# Patient Record
Sex: Female | Born: 1975 | Race: Black or African American | Hispanic: No | Marital: Single | Smoking: Former smoker
Health system: Southern US, Community
[De-identification: ages and names within clinical notes are randomized; demographics above are authoritative.]

## PROBLEM LIST (undated history)

## (undated) DIAGNOSIS — F32A Depression, unspecified: Secondary | ICD-10-CM

## (undated) DIAGNOSIS — J939 Pneumothorax, unspecified: Secondary | ICD-10-CM

## (undated) DIAGNOSIS — F419 Anxiety disorder, unspecified: Secondary | ICD-10-CM

## (undated) DIAGNOSIS — F1021 Alcohol dependence, in remission: Secondary | ICD-10-CM

## (undated) DIAGNOSIS — Z8639 Personal history of other endocrine, nutritional and metabolic disease: Secondary | ICD-10-CM

## (undated) DIAGNOSIS — R011 Cardiac murmur, unspecified: Secondary | ICD-10-CM

## (undated) DIAGNOSIS — D649 Anemia, unspecified: Secondary | ICD-10-CM

## (undated) DIAGNOSIS — F329 Major depressive disorder, single episode, unspecified: Secondary | ICD-10-CM

## (undated) HISTORY — PX: APPENDECTOMY: SHX54

---
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
## 2008-05-10 ENCOUNTER — Emergency Department: Payer: Self-pay | Admitting: Emergency Medicine

## 2008-05-10 ENCOUNTER — Other Ambulatory Visit: Payer: Self-pay

## 2008-06-30 ENCOUNTER — Emergency Department: Payer: Self-pay | Admitting: Emergency Medicine

## 2008-07-13 ENCOUNTER — Emergency Department (HOSPITAL_COMMUNITY): Admission: EM | Admit: 2008-07-13 | Discharge: 2008-07-13 | Payer: Self-pay | Admitting: Emergency Medicine

## 2010-08-31 ENCOUNTER — Encounter: Payer: Self-pay | Admitting: Family Medicine

## 2011-05-15 LAB — BASIC METABOLIC PANEL
BUN: 12 mg/dL (ref 6–23)
Chloride: 107 mEq/L (ref 96–112)
GFR calc Af Amer: >60 mL/min (ref >60)
GFR calc non Af Amer: >60 mL/min (ref >60)
Potassium: 3.7 mEq/L (ref 3.5–5.1)

## 2011-05-15 LAB — CBC
HCT: 32.7 % — ABNORMAL LOW (ref 36.0–46.0)
Platelets: 99 10*3/uL — ABNORMAL LOW (ref 150–400)
RBC: 4.8 MIL/uL (ref 3.87–5.11)
WBC: 5.8 10*3/uL (ref 4.0–10.5)

## 2011-05-15 LAB — URINALYSIS, ROUTINE W REFLEX MICROSCOPIC
Nitrite: POSITIVE — AB
Urobilinogen, UA: 1 mg/dL (ref 0.0–1.0)

## 2011-05-15 LAB — DIFFERENTIAL
Eosinophils Relative: 0 % (ref 0–5)
Lymphocytes Relative: 8 % — ABNORMAL LOW (ref 12–46)
Lymphs Abs: 0.5 10*3/uL — ABNORMAL LOW (ref 0.7–4.0)
Monocytes Relative: 0 % — ABNORMAL LOW (ref 3–12)
Neutrophils Relative %: 92 % — ABNORMAL HIGH (ref 43–77)

## 2011-05-15 LAB — URINE MICROSCOPIC-ADD ON

## 2011-05-15 LAB — URINE CULTURE: Colony Count: 60000

## 2016-08-18 ENCOUNTER — Inpatient Hospital Stay (HOSPITAL_COMMUNITY)
Admission: EM | Admit: 2016-08-18 | Discharge: 2016-08-20 | DRG: 812 | Disposition: A | Discharge status: Home / Self Care | Payer: Medicaid - Out of State | Attending: Internal Medicine | Admitting: Internal Medicine

## 2016-08-18 ENCOUNTER — Encounter (HOSPITAL_COMMUNITY): Payer: Self-pay

## 2016-08-18 DIAGNOSIS — R197 Diarrhea, unspecified: Secondary | ICD-10-CM | POA: Diagnosis present

## 2016-08-18 DIAGNOSIS — R112 Nausea with vomiting, unspecified: Secondary | ICD-10-CM | POA: Diagnosis present

## 2016-08-18 DIAGNOSIS — Z79899 Other long term (current) drug therapy: Secondary | ICD-10-CM

## 2016-08-18 DIAGNOSIS — N920 Excessive and frequent menstruation with regular cycle: Secondary | ICD-10-CM | POA: Diagnosis present

## 2016-08-18 DIAGNOSIS — D500 Iron deficiency anemia secondary to blood loss (chronic): Secondary | ICD-10-CM | POA: Diagnosis present

## 2016-08-18 DIAGNOSIS — R109 Unspecified abdominal pain: Secondary | ICD-10-CM

## 2016-08-18 DIAGNOSIS — D259 Leiomyoma of uterus, unspecified: Secondary | ICD-10-CM | POA: Diagnosis present

## 2016-08-18 DIAGNOSIS — R1110 Vomiting, unspecified: Secondary | ICD-10-CM

## 2016-08-18 DIAGNOSIS — N390 Urinary tract infection, site not specified: Secondary | ICD-10-CM | POA: Diagnosis present

## 2016-08-18 DIAGNOSIS — K529 Noninfective gastroenteritis and colitis, unspecified: Secondary | ICD-10-CM | POA: Diagnosis present

## 2016-08-18 DIAGNOSIS — Z881 Allergy status to other antibiotic agents status: Secondary | ICD-10-CM

## 2016-08-18 DIAGNOSIS — F1021 Alcohol dependence, in remission: Secondary | ICD-10-CM | POA: Diagnosis present

## 2016-08-18 DIAGNOSIS — E876 Hypokalemia: Secondary | ICD-10-CM | POA: Diagnosis present

## 2016-08-18 DIAGNOSIS — D649 Anemia, unspecified: Secondary | ICD-10-CM | POA: Diagnosis present

## 2016-08-18 HISTORY — DX: Alcohol dependence, in remission: F10.21

## 2016-08-18 HISTORY — DX: Personal history of other endocrine, nutritional and metabolic disease: Z86.39

## 2016-08-18 HISTORY — DX: Pneumothorax, unspecified: J93.9

## 2016-08-18 HISTORY — DX: Anemia, unspecified: D64.9

## 2016-08-18 LAB — COMPREHENSIVE METABOLIC PANEL
ALBUMIN: 3.7 g/dL (ref 3.5–5.0)
ALT: 17 U/L (ref 14–54)
AST: 29 U/L (ref 15–41)
Alkaline Phosphatase: 57 U/L (ref 38–126)
Anion gap: 13 (ref 5–15)
BILIRUBIN TOTAL: 0.6 mg/dL (ref 0.3–1.2)
BUN: 11 mg/dL (ref 6–20)
CHLORIDE: 94 mmol/L — AB (ref 101–111)
CO2: 26 mmol/L (ref 22–32)
Calcium: 8.3 mg/dL — ABNORMAL LOW (ref 8.9–10.3)
Creatinine, Ser: 0.71 mg/dL (ref 0.44–1.00)
GFR calc Af Amer: >60 mL/min (ref >60)
GFR calc non Af Amer: >60 mL/min (ref >60)
GLUCOSE: 108 mg/dL — AB (ref 65–99)
POTASSIUM: 2.6 mmol/L — AB (ref 3.5–5.1)
SODIUM: 133 mmol/L — AB (ref 135–145)
TOTAL PROTEIN: 7.9 g/dL (ref 6.5–8.1)

## 2016-08-18 LAB — CBC
HEMATOCRIT: 17.8 % — AB (ref 36.0–46.0)
Hemoglobin: 4.6 g/dL — CL (ref 12.0–15.0)
MCH: 16.3 pg — AB (ref 26.0–34.0)
MCHC: 25.8 g/dL — AB (ref 30.0–36.0)
MCV: 63.1 fL — AB (ref 78.0–100.0)
Platelets: 520 10*3/uL — ABNORMAL HIGH (ref 150–400)
RBC: 2.82 MIL/uL — ABNORMAL LOW (ref 3.87–5.11)
RDW: 21.4 % — ABNORMAL HIGH (ref 11.5–15.5)
WBC: 13.6 10*3/uL — ABNORMAL HIGH (ref 4.0–10.5)

## 2016-08-18 LAB — LIPASE, BLOOD: Lipase: 26 U/L (ref 11–51)

## 2016-08-18 MED ORDER — ONDANSETRON HCL 4 MG/2ML IJ SOLN
4.0000 mg | Freq: Once | INTRAMUSCULAR | Status: AC | PRN
  Administered 2016-08-18: 4 mg via INTRAVENOUS
  Filled 2016-08-18: qty 2

## 2016-08-18 MED ORDER — SODIUM CHLORIDE 0.9 % IV BOLUS (SEPSIS)
2000.0000 mL | Freq: Once | INTRAVENOUS | Status: AC
  Administered 2016-08-18: 2000 mL via INTRAVENOUS

## 2016-08-18 NOTE — ED Notes (Signed)
CRITICAL VALUE ALERT  Critical value received: Hbg- 4.6  Date of notification:  08/18/16  Time of notification:  2333  Critical value read back:Yes.    Nurse who received alert:  Idelia Salm, RN  MD notified (1st page):  2334  Time of first page:  2334  MD notified (2nd page):  Time of second page:  Responding MD:  Dr Venora Maples  Time MD responded:  2335
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
## 2016-08-18 NOTE — ED Triage Notes (Signed)
For the past 7 days I have been nauseated and vomiting.  Having abdominal pain and diarrhea.  Feeling dizzy and feel like I am going to pass out.  Having periods of weakness and shortness of breath.

## 2016-08-19 ENCOUNTER — Observation Stay (HOSPITAL_COMMUNITY): Payer: Medicaid - Out of State

## 2016-08-19 ENCOUNTER — Encounter (HOSPITAL_COMMUNITY): Payer: Self-pay

## 2016-08-19 DIAGNOSIS — R112 Nausea with vomiting, unspecified: Secondary | ICD-10-CM | POA: Diagnosis present

## 2016-08-19 DIAGNOSIS — D500 Iron deficiency anemia secondary to blood loss (chronic): Secondary | ICD-10-CM | POA: Diagnosis present

## 2016-08-19 DIAGNOSIS — Z79899 Other long term (current) drug therapy: Secondary | ICD-10-CM | POA: Diagnosis not present

## 2016-08-19 DIAGNOSIS — E876 Hypokalemia: Secondary | ICD-10-CM | POA: Diagnosis present

## 2016-08-19 DIAGNOSIS — N390 Urinary tract infection, site not specified: Secondary | ICD-10-CM | POA: Diagnosis present

## 2016-08-19 DIAGNOSIS — N920 Excessive and frequent menstruation with regular cycle: Secondary | ICD-10-CM | POA: Diagnosis present

## 2016-08-19 DIAGNOSIS — D649 Anemia, unspecified: Secondary | ICD-10-CM | POA: Diagnosis present

## 2016-08-19 DIAGNOSIS — F1021 Alcohol dependence, in remission: Secondary | ICD-10-CM | POA: Diagnosis present

## 2016-08-19 DIAGNOSIS — D259 Leiomyoma of uterus, unspecified: Secondary | ICD-10-CM | POA: Diagnosis present

## 2016-08-19 DIAGNOSIS — R197 Diarrhea, unspecified: Secondary | ICD-10-CM | POA: Diagnosis present

## 2016-08-19 DIAGNOSIS — K529 Noninfective gastroenteritis and colitis, unspecified: Secondary | ICD-10-CM | POA: Diagnosis present

## 2016-08-19 DIAGNOSIS — Z881 Allergy status to other antibiotic agents status: Secondary | ICD-10-CM | POA: Diagnosis not present

## 2016-08-19 LAB — IRON AND TIBC
Iron: 7 ug/dL — ABNORMAL LOW (ref 28–170)
Saturation Ratios: 1 % — ABNORMAL LOW (ref 10.4–31.8)
TIBC: 535 ug/dL — ABNORMAL HIGH (ref 250–450)
UIBC: 528 ug/dL

## 2016-08-19 LAB — URINALYSIS, ROUTINE W REFLEX MICROSCOPIC
Bilirubin Urine: NEGATIVE
Glucose, UA: NEGATIVE mg/dL
Hgb urine dipstick: NEGATIVE
Ketones, ur: 5 mg/dL — AB
Nitrite: NEGATIVE
PH: 6 (ref 5.0–8.0)
Protein, ur: NEGATIVE mg/dL
SPECIFIC GRAVITY, URINE: 1.011 (ref 1.005–1.030)

## 2016-08-19 LAB — CBC
HEMATOCRIT: 23.8 % — AB (ref 36.0–46.0)
Hemoglobin: 6.9 g/dL — CL (ref 12.0–15.0)
MCH: 20.4 pg — ABNORMAL LOW (ref 26.0–34.0)
MCHC: 29 g/dL — AB (ref 30.0–36.0)
MCV: 70.2 fL — AB (ref 78.0–100.0)
Platelets: 454 10*3/uL — ABNORMAL HIGH (ref 150–400)
RBC: 3.39 MIL/uL — ABNORMAL LOW (ref 3.87–5.11)
RDW: 24.9 % — AB (ref 11.5–15.5)
WBC: 11.2 10*3/uL — AB (ref 4.0–10.5)

## 2016-08-19 LAB — MAGNESIUM: Magnesium: 1.5 mg/dL — ABNORMAL LOW (ref 1.7–2.4)

## 2016-08-19 LAB — RETICULOCYTES
RBC.: 2.86 MIL/uL — AB (ref 3.87–5.11)
RETIC COUNT ABSOLUTE: 57.2 10*3/uL (ref 19.0–186.0)
RETIC CT PCT: 2 % (ref 0.4–3.1)

## 2016-08-19 LAB — BASIC METABOLIC PANEL
Anion gap: 9 (ref 5–15)
BUN: 9 mg/dL (ref 6–20)
CHLORIDE: 100 mmol/L — AB (ref 101–111)
CO2: 25 mmol/L (ref 22–32)
Calcium: 7.7 mg/dL — ABNORMAL LOW (ref 8.9–10.3)
Creatinine, Ser: 0.55 mg/dL (ref 0.44–1.00)
GFR calc Af Amer: >60 mL/min (ref >60)
GFR calc non Af Amer: >60 mL/min (ref >60)
GLUCOSE: 113 mg/dL — AB (ref 65–99)
POTASSIUM: 3.8 mmol/L (ref 3.5–5.1)
Sodium: 134 mmol/L — ABNORMAL LOW (ref 135–145)

## 2016-08-19 LAB — FERRITIN: Ferritin: 3 ng/mL — ABNORMAL LOW (ref 11–307)

## 2016-08-19 LAB — HEMOGLOBIN AND HEMATOCRIT, BLOOD
HEMATOCRIT: 27.5 % — AB (ref 36.0–46.0)
Hemoglobin: 8.2 g/dL — ABNORMAL LOW (ref 12.0–15.0)

## 2016-08-19 LAB — PREPARE RBC (CROSSMATCH)

## 2016-08-19 LAB — HCG, SERUM, QUALITATIVE: PREG SERUM: NEGATIVE

## 2016-08-19 LAB — ABO/RH: ABO/RH(D): A POS

## 2016-08-19 LAB — VITAMIN B12: VITAMIN B 12: 375 pg/mL (ref 180–914)

## 2016-08-19 LAB — FOLATE: Folate: 10.2 ng/mL (ref >5.9)

## 2016-08-19 MED ORDER — METHYLPREDNISOLONE SODIUM SUCC 40 MG IJ SOLR
60.0000 mg | Freq: Once | INTRAMUSCULAR | Status: AC
Start: 2016-08-19 — End: 2016-08-19

## 2016-08-19 MED ORDER — SODIUM CHLORIDE 0.9 % IV SOLN: Freq: Once | INTRAVENOUS | Status: AC

## 2016-08-19 MED ORDER — ONDANSETRON HCL 4 MG/2ML IJ SOLN
INTRAMUSCULAR | Status: AC
  Filled 2016-08-19: qty 2

## 2016-08-19 MED ORDER — MAGNESIUM SULFATE IN D5W 1-5 GM/100ML-% IV SOLN
1.0000 g | Freq: Once | INTRAVENOUS | Status: AC
  Administered 2016-08-19: 1 g via INTRAVENOUS

## 2016-08-19 MED ORDER — HYDROMORPHONE HCL 1 MG/ML IJ SOLN
0.5000 mg | Freq: Once | INTRAMUSCULAR | Status: AC
  Administered 2016-08-19: 0.5 mg via INTRAVENOUS
  Filled 2016-08-19: qty 1

## 2016-08-19 MED ORDER — ADULT MULTIVITAMIN W/MINERALS CH
1.0000 | ORAL_TABLET | Freq: Every day | ORAL | Status: DC
  Administered 2016-08-19 – 2016-08-20 (×2): 1 via ORAL
  Filled 2016-08-19 (×4): qty 1

## 2016-08-19 MED ORDER — HYDROMORPHONE HCL 1 MG/ML IJ SOLN
0.5000 mg | INTRAMUSCULAR | Status: DC | PRN
  Administered 2016-08-19 – 2016-08-20 (×5): 0.5 mg via INTRAVENOUS
  Filled 2016-08-19 (×5): qty 1

## 2016-08-19 MED ORDER — SODIUM CHLORIDE 0.9 % IV SOLN
Freq: Once | INTRAVENOUS | Status: AC
  Administered 2016-08-19: 02:00:00 via INTRAVENOUS

## 2016-08-19 MED ORDER — DIPHENHYDRAMINE HCL 50 MG/ML IJ SOLN
25.0000 mg | Freq: Once | INTRAMUSCULAR | Status: AC
Start: 2016-08-19 — End: 2016-08-19
  Administered 2016-08-19: 25 mg via INTRAVENOUS

## 2016-08-19 MED ORDER — POTASSIUM CHLORIDE IN NACL 40-0.9 MEQ/L-% IV SOLN
INTRAVENOUS | Status: DC
  Administered 2016-08-19: 100 mL/h via INTRAVENOUS
  Filled 2016-08-19 (×3): qty 1000

## 2016-08-19 MED ORDER — POTASSIUM CHLORIDE CRYS ER 20 MEQ PO TBCR
40.0000 meq | EXTENDED_RELEASE_TABLET | Freq: Once | ORAL | Status: AC
  Administered 2016-08-19: 40 meq via ORAL
  Filled 2016-08-19: qty 2

## 2016-08-19 MED ORDER — ONDANSETRON HCL 4 MG/2ML IJ SOLN
4.0000 mg | Freq: Once | INTRAMUSCULAR | Status: AC | PRN
  Administered 2016-08-19: 4 mg via INTRAVENOUS

## 2016-08-19 MED ORDER — FAMOTIDINE IN NACL 20-0.9 MG/50ML-% IV SOLN
20.0000 mg | INTRAVENOUS | Status: DC
  Administered 2016-08-19 – 2016-08-20 (×2): 20 mg via INTRAVENOUS
  Filled 2016-08-19 (×3): qty 50

## 2016-08-19 MED ORDER — DEXTROSE 5 % IV SOLN
1.0000 g | INTRAVENOUS | Status: DC
  Administered 2016-08-19: 1 g via INTRAVENOUS
  Filled 2016-08-19: qty 10

## 2016-08-19 MED ORDER — PROMETHAZINE HCL 25 MG/ML IJ SOLN: 12.5000 mg | Freq: Four times a day (QID) | INTRAMUSCULAR | Status: DC | PRN

## 2016-08-19 MED ORDER — KCL IN DEXTROSE-NACL 20-5-0.9 MEQ/L-%-% IV SOLN
INTRAVENOUS | Status: DC
  Administered 2016-08-19: 03:00:00 via INTRAVENOUS
  Filled 2016-08-19 (×5): qty 1000

## 2016-08-19 MED ORDER — FERROUS SULFATE 325 (65 FE) MG PO TABS
325.0000 mg | ORAL_TABLET | Freq: Every day | ORAL | Status: DC
  Administered 2016-08-19: 325 mg via ORAL
  Filled 2016-08-19 (×3): qty 1

## 2016-08-19 MED ORDER — ESCITALOPRAM OXALATE 10 MG PO TABS
10.0000 mg | ORAL_TABLET | Freq: Every day | ORAL | Status: DC
  Administered 2016-08-19 – 2016-08-20 (×2): 10 mg via ORAL
  Filled 2016-08-19 (×4): qty 1

## 2016-08-19 MED ORDER — DIPHENHYDRAMINE HCL 50 MG/ML IJ SOLN
INTRAMUSCULAR | Status: AC
  Filled 2016-08-19: qty 1

## 2016-08-19 MED ORDER — POTASSIUM CHLORIDE 10 MEQ/100ML IV SOLN
10.0000 meq | INTRAVENOUS | Status: AC
  Administered 2016-08-19 (×3): 10 meq via INTRAVENOUS
  Filled 2016-08-19 (×3): qty 100

## 2016-08-19 MED ORDER — VITAMIN D (ERGOCALCIFEROL) 1.25 MG (50000 UNIT) PO CAPS: 50000.0000 [IU] | ORAL_CAPSULE | ORAL | Status: DC

## 2016-08-19 MED ORDER — METHYLPREDNISOLONE SODIUM SUCC 125 MG IJ SOLR
INTRAMUSCULAR | Status: AC
  Administered 2016-08-19: 60 mg
  Filled 2016-08-19: qty 2

## 2016-08-19 MED ORDER — ONDANSETRON HCL 4 MG/2ML IJ SOLN
4.0000 mg | Freq: Four times a day (QID) | INTRAMUSCULAR | Status: DC | PRN
  Filled 2016-08-19: qty 2

## 2016-08-19 MED ORDER — DIPHENHYDRAMINE HCL 25 MG PO CAPS
25.0000 mg | ORAL_CAPSULE | ORAL | Status: DC | PRN
  Administered 2016-08-20: 25 mg via ORAL
  Filled 2016-08-19: qty 1

## 2016-08-19 NOTE — ED Notes (Signed)
CRITICAL VALUE ALERT  Critical value received:  hgb 6.9  Date of notification:  08/19/16  Time of notification:  0820  Critical value read back:Yes.    Nurse who received alert:  c Allante Whitmire rn  MD notified (1st page):  Dr Caryn Section [phone]  Time of first page:  0850  MD notified (2nd page):  Time of second page:  Responding MD:    Time MD responded:
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
## 2016-08-19 NOTE — Progress Notes (Signed)
1433 Hgb 8.2 post 3 blood transfusions, MD notified.

## 2016-08-19 NOTE — ED Notes (Signed)
Pt has received 1 unit of PRBC for a total of 335 ml.  Currently have not started the second blood infusion.  Duplicate volume entered on vitals signs post #1 unit of PRBC.

## 2016-08-19 NOTE — ED Notes (Signed)
CRITICAL VALUE ALERT  Critical value received:  Potassium 2.6  Date of notification:  08/18/16  Time of notification:  2359  Critical value read back:yes  Nurse who received alert: Legrand Como, RN  MD notified (1st page):  Dr. Venora Maples  Time of first page:  0000

## 2016-08-19 NOTE — Progress Notes (Signed)
Patient comfortable in bed. Introduction of self. Patient made aware to call when needed and that a urine and stool sample is needed when possible.

## 2016-08-19 NOTE — ED Notes (Signed)
Blood finished. Lab aware and will draw labs one hour after to see if 3rd unit blood needed per Dr Marin Comment. Pt c/o nausea.

## 2016-08-19 NOTE — ED Notes (Signed)
Pt having allergic reaction to rocephin which was immediately stopped. Dr Loma Newton called and received vo. Pt denise trouble breathing or swallowing pt has redness and mild welts to face.

## 2016-08-19 NOTE — ED Notes (Signed)
Lab aware needing 3rd unit of blood to transfuse. [place] also aware dr Caryn Section wanted transvaginal US also.

## 2016-08-19 NOTE — ED Notes (Signed)
Pt taken to [place]. Nad.

## 2016-08-19 NOTE — Progress Notes (Signed)
Patient is a 41 year old with a history of hypokalemia anxiety/depression, long history of heavy menstrual periods with anemia, and recovering alcoholic, who was admitted this morning by Dr. Marin Comment for N/V/D and weakness for several days. She was found to have a hemoglobin of 4.6, microcytosis with MCV of 63, and potassium of 2.6. Her WBC was 13.6 and her platelet count was 520. Her urinalysis revealed a few bacteria and 6-30 WBCs.  -Patient was transfused 2 units of packed red blood cells and her hemoglobin improved to 6.9. Will transfuse another unit. -Patient was given several ones of potassium chloride and potassium chloride orally. Her serum potassium improved from 2.6 to 3.8. Her magnesium level was 1.5. She was given IV magnesium sulfate. We'll continue supplementing her potassium in the IV fluids. -Due to the N/V/D, with some lower back pain, will start when necessary Zofran and Phenergan; start IV Pepcid; order a GI pathogen panel; and downgrade her diet to full liquids. Start hydromorphone as needed for pain. -Due to a suspicion of UTI, will order a urine culture and start Rocephin empirically. -For further investigation, will order a pelvic ultrasound and abdominal ultrasound. -Patient will likely need an outpatient gynecology referral.

## 2016-08-19 NOTE — ED Notes (Cosign Needed)
Pt informed this nurse that she has not been taking iron tablets as she is supposed to, before this past week as she was sick and couldn't take anything PO- she was only taking iron pills about 3 days out of a week. Dr Venora Maples informed.

## 2016-08-19 NOTE — ED Notes (Signed)
Per NT, pt states she saw a little bit of blood in her stool just now. Dr Caryn Section aware.

## 2016-08-19 NOTE — ED Notes (Signed)
Blood consent has been signed by patient.

## 2016-08-19 NOTE — ED Notes (Signed)
Dr Caryn Section in with pt

## 2016-08-19 NOTE — ED Notes (Signed)
Pt just returned from [place]. States back pain is back rating 9. Advised pt pain meds not due until approx 30 more minutes. Floor rn aware.

## 2016-08-20 DIAGNOSIS — D500 Iron deficiency anemia secondary to blood loss (chronic): Principal | ICD-10-CM

## 2016-08-20 LAB — BASIC METABOLIC PANEL
ANION GAP: 3 — AB (ref 5–15)
BUN: 8 mg/dL (ref 6–20)
CO2: 25 mmol/L (ref 22–32)
Calcium: 8.3 mg/dL — ABNORMAL LOW (ref 8.9–10.3)
Chloride: 107 mmol/L (ref 101–111)
Creatinine, Ser: 0.54 mg/dL (ref 0.44–1.00)
Glucose, Bld: 97 mg/dL (ref 65–99)
Potassium: 4.2 mmol/L (ref 3.5–5.1)
SODIUM: 135 mmol/L (ref 135–145)

## 2016-08-20 LAB — CBC
HEMATOCRIT: 27.2 % — AB (ref 36.0–46.0)
Hemoglobin: 7.9 g/dL — ABNORMAL LOW (ref 12.0–15.0)
MCH: 21 pg — ABNORMAL LOW (ref 26.0–34.0)
MCHC: 29 g/dL — ABNORMAL LOW (ref 30.0–36.0)
MCV: 72.1 fL — AB (ref 78.0–100.0)
PLATELETS: 462 10*3/uL — AB (ref 150–400)
RBC: 3.77 MIL/uL — AB (ref 3.87–5.11)
RDW: 23.1 % — ABNORMAL HIGH (ref 11.5–15.5)
WBC: 10.7 10*3/uL — AB (ref 4.0–10.5)

## 2016-08-20 LAB — GASTROINTESTINAL PANEL BY PCR, STOOL (REPLACES STOOL CULTURE)

## 2016-08-20 LAB — MAGNESIUM: Magnesium: 1.5 mg/dL — ABNORMAL LOW (ref 1.7–2.4)

## 2016-08-20 MED ORDER — PANTOPRAZOLE SODIUM 40 MG PO TBEC: 40.0000 mg | DELAYED_RELEASE_TABLET | Freq: Every day | ORAL | 0 refills | Status: DC

## 2016-08-20 MED ORDER — SODIUM CHLORIDE 0.9 % IV SOLN
510.0000 mg | Freq: Once | INTRAVENOUS | Status: AC
  Administered 2016-08-20: 510 mg via INTRAVENOUS
  Filled 2016-08-20: qty 17

## 2016-08-20 MED ORDER — FERROUS SULFATE 325 (65 FE) MG PO TABS: 325.0000 mg | ORAL_TABLET | Freq: Three times a day (TID) | ORAL | 0 refills | Status: DC

## 2016-08-20 NOTE — Progress Notes (Signed)
Patient with orders to be discharge home. Discharge instructions given, patient verbalized understanding. Prescriptions given. Patient stable. Patient left in private vehicle with family.  
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
## 2016-08-20 NOTE — Discharge Summary (Signed)
Brittney Melendez [id number] DOB: 1975/11/16 DOA: 08/18/2016  PCP: No PCP Per Patient  Admit date: 08/18/2016  Discharge date: 08/20/2016  Admitted From: Home   Disposition:  Home   Recommendations for Outpatient Follow-up:   Follow up with PCP in 1-2 weeks  PCP Please obtain BMP/CBC, 2 view CXR in 1week,  (see Discharge instructions)   PCP Please follow up on the following pending results: Follow anemia/iron panel closely.   Home Health: None  Equipment/Devices: None Consultations: None Discharge Condition: Stable   CODE STATUS: Full   Diet Recommendation:  Heart Healthy    Chief Complaint  Patient presents with  . Emesis     Brief history of present illness from the day of admission and additional interim summary    Brittney Melendez is an 41 y.o. female with hx of menorrhagia, on iron supplement, hardly ever saw her gynecologist, presented to the ER with nausea, vomiting, and watery diarrhea.  She had no evidence of GI bleeding and had no epigastric pain.  Evaluation in the ER included stable hemodynamics.  HR 110.  Her Hb was found to be 4 g per dL with microcytic indices.  She admitted to feeling weak and having DOE.   Her K was low at 2.6.  She has normal renal fx.  Hospitalist was asked to admit her for anemia due to menorrhagia.   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 Will be discharged home on thrice a day iron supplementation with outpatient PCP and GYN follow-up. No evidence  of GI blood loss.  2. Gastroenteritis. Resolved.  Discharge diagnosis     Principal Problem:   Nausea & vomiting Active Problems:   Menorrhagia   Hypokalemia   Anemia due to chronic blood loss   Diarrhea in adult patient   UTI (urinary tract infection) with pyuria   Anemia    Discharge instructions    Discharge Instructions    Diet - low sodium heart healthy    Complete by:  As directed    Discharge instructions    Complete by:  As directed    Follow with Primary MD in 7 days   Get CBC, CMP, Iron panel checked  by Primary MD or SNF MD in 5-7 days ( we routinely change or add medications that can affect your baseline labs and fluid status, therefore we recommend that you get the mentioned basic workup next visit with your PCP, your PCP may decide not to get them or add new tests based on their clinical decision)   Activity: As tolerated with Full fall precautions use walker/cane & assistance as needed   Disposition Home     Diet:  Heart Healthy  For Heart failure patients - Check your Weight same time everyday, if you gain over 2 pounds, or you develop in leg swelling, experience more shortness of breath or chest pain, call your Primary MD immediately. Follow Cardiac Low Salt Diet and 1.5 lit/day fluid restriction.   On your next visit with your primary care physician please Get Medicines reviewed and adjusted.   Please request your Prim.MD to go over all Hospital Tests and Procedure/Radiological results at the follow up, please get all Hospital records sent to your Prim MD by signing hospital release before you go home.   If you experience worsening of your admission symptoms, develop shortness of breath, life threatening emergency, suicidal or homicidal thoughts you must seek medical attention immediately by calling 911 or calling your MD immediately  if symptoms less severe.  You Must read complete instructions/literature along with all the possible adverse  reactions/side effects for all the Medicines you take and that have been prescribed to you. Take any new Medicines after you have completely understood and accpet all the possible adverse reactions/side effects.   Do not drive, operate heavy machinery, perform activities at heights, swimming or participation in water activities or provide baby sitting services if your were admitted for syncope or siezures until you have seen by Primary MD or a Neurologist and advised to do so again.  Do not drive when taking Pain medications.    Do not take more than prescribed Pain, Sleep and Anxiety Medications  Special Instructions: If you have smoked or chewed Tobacco  in the last 2 yrs please stop smoking, stop any regular Alcohol  and or any Recreational drug use.  Wear Seat belts while driving.   Please note  You were cared for by a hospitalist during your hospital stay. If you have any questions about your discharge medications or the care you received while you were in the hospital after you are discharged, you can call the unit and asked to speak with the hospitalist on call if the hospitalist that took care of you is not available. Once you are discharged, your primary care physician will handle any further medical issues. Please note that NO REFILLS for any discharge medications will be authorized once you are discharged, as it is imperative that you return to your primary care physician (or establish a relationship with a primary care physician if you do not have one) for your aftercare needs so that they can reassess your need for medications and monitor your lab values.   Increase activity slowly    Complete by:  As directed       Discharge Medications   Allergies as of 08/20/2016      Reactions   Rocephin [ceftriaxone] Rash   Sulfa Antibiotics Itching      Medication List    TAKE these medications   escitalopram 10 MG tablet Commonly known as:  LEXAPRO Take 10 mg by mouth daily.     ferrous sulfate 325 (65 FE) MG tablet Take 1 tablet (325 mg total) by mouth 3 (three) times daily with meals. What changed:  when to take this   MULTIVITAMIN/FLUORIDE 1 MG Chew Chew 1 tablet by mouth daily.   ondansetron 4 MG tablet Commonly known as:  ZOFRAN TAKE 1 TABLET BY MOUTH 3 TIMES A DAY AS NEEDED FOR NAUSEA  AND VOMITING   pantoprazole 40 MG tablet Commonly known as:  PROTONIX Take 1 tablet (40 mg total) by mouth daily.   Vitamin D (Ergocalciferol) 50000  units Caps capsule Commonly known as:  DRISDOL Take 50,000 Units by mouth once a week.       Follow-up Information    [name], MD. Schedule an appointment as soon as possible for a visit in 1 week(s).   Specialties:  Obstetrics and Gynecology, Radiology Why:  Menorrhagia with severe anemia Contact information: [address] [place] [id number] [phone]           Major procedures and Radiology Reports - PLEASE review detailed and final reports thoroughly  -         US Abdomen Complete  Result Date: 08/19/2016 CLINICAL DATA:  Nausea, vomiting, diarrhea for 1 week EXAM: ABDOMEN ULTRASOUND COMPLETE COMPARISON:  None. FINDINGS: Gallbladder: No gallstones or wall thickening visualized. No sonographic Murphy sign noted by sonographer. Common bile duct: Diameter: 5 mm in diameter within normal limits Liver: No focal hepatic mass. Mild increase liver echogenicity suspicious for fatty infiltration. IVC: No abnormality visualized. Pancreas: Visualized portion unremarkable. Limited visualization due to abundant bowel gas Spleen: Size and appearance within normal limits. Measures 9 cm in length Right Kidney: Length: 11.5 cm. Echogenicity within normal limits. No mass or hydronephrosis visualized. Left Kidney: Length: 11.8 cm. Echogenicity within normal limits. No mass or hydronephrosis visualized. Abdominal aorta: No aneurysm visualized. Measures up to 1.9 cm in diameter. Other findings: None. IMPRESSION:  1. No gallstones are noted within gallbladder. No sonographic Murphy's sign. 2. Mild increased liver echogenicity suspicious for fatty infiltration. 3. No hydronephrosis. 4. No aortic aneurysm. Electronically Signed   By: Lahoma Crocker M.D.   On: 08/19/2016 12:37   US Transvaginal Non-ob  Result Date: 08/19/2016 CLINICAL DATA:  Nausea and vomiting.  Abnormal uterine bleeding. EXAM: TRANSABDOMINAL AND TRANSVAGINAL ULTRASOUND OF PELVIS TECHNIQUE: Both transabdominal and transvaginal ultrasound examinations of the pelvis were performed. Transabdominal technique was performed for global imaging of the pelvis including uterus, ovaries, adnexal regions, and pelvic cul-de-sac. It was necessary to proceed with endovaginal exam following the transabdominal exam to visualize the uterus and ovaries. COMPARISON:  No recent prior. FINDINGS: Uterus Measurements: 12.6 x 5.9 x 6.7 cm. 2.5 cm anterior fundal fibroid. 2.8 cm posterior body fibroid. Nabothian cyst. Endometrium Thickness:  13 mm.  No focal abnormality visualized. Right ovary Measurements: 3.9 x 1.9 x 2.3 cm. Normal appearance/no adnexal mass. Left ovary Measurements: 2.3 x 3.4 x 2.3 cm. Normal appearance/no adnexal mass. Other findings No abnormal free fluid. IMPRESSION: 1.  Small uterine fibroids. 2. Exam otherwise unremarkable . Electronically Signed   By: Marcello Moores  Register   On: 08/19/2016 12:34   US Pelvis Complete  Result Date: 08/19/2016 CLINICAL DATA:  Nausea and vomiting.  Abnormal uterine bleeding. EXAM: TRANSABDOMINAL AND TRANSVAGINAL ULTRASOUND OF PELVIS TECHNIQUE: Both transabdominal and transvaginal ultrasound examinations of the pelvis were performed. Transabdominal technique was performed for global imaging of the pelvis including uterus, ovaries, adnexal regions, and pelvic cul-de-sac. It was necessary to proceed with endovaginal exam following the transabdominal exam to visualize the uterus and ovaries. COMPARISON:  No recent prior. FINDINGS:  Uterus Measurements: 12.6 x 5.9 x 6.7 cm. 2.5 cm anterior fundal fibroid. 2.8 cm posterior body fibroid. Nabothian cyst. Endometrium Thickness:  13 mm.  No focal abnormality visualized. Right ovary Measurements: 3.9 x 1.9 x 2.3 cm. Normal appearance/no adnexal mass. Left ovary Measurements: 2.3 x 3.4 x 2.3 cm. Normal appearance/no adnexal mass. Other findings No abnormal free fluid. IMPRESSION: 1.  Small uterine fibroids. 2. Exam otherwise unremarkable . Electronically Signed  By: [place]   On: 08/19/2016 12:34    Micro Results    No results found for this or any previous visit (from the past 240 hour(s)).  Today   Subjective    Anvi Srey today has no headache,no chest abdominal pain,no new weakness tingling or numbness, feels much better wants to go home today.    Objective   Blood pressure 115/65, pulse 64, temperature 98.1 F (36.7 C), temperature source Oral, resp. rate 20, height 5\' 7"  (1.702 m), weight 74.8 kg (165 lb), last menstrual period 08/08/2016, SpO2 100 %.   Intake/Output Summary (Last 24 hours) at 08/20/16 1024 Last data filed at 08/19/16 2100  Gross per 24 hour  Intake             1625 ml  Output              500 ml  Net             1125 ml    Exam Awake Alert, Oriented x 3, No new F.N deficits, Normal affect [place].AT,PERRAL Supple Neck,No JVD, No cervical lymphadenopathy appriciated.  Symmetrical Chest wall movement, Good air movement bilaterally, CTAB RRR,No Gallops,Rubs or new Murmurs, No Parasternal Heave +ve B.Sounds, Abd Soft, Non tender, No organomegaly appriciated, No rebound -guarding or rigidity. No Cyanosis, Clubbing or edema, No new Rash or bruise   Data Review   CBC w Diff: Lab Results  Component Value Date   WBC 10.7 (H) 08/20/2016   HGB 7.9 (L) 08/20/2016   HCT 27.2 (L) 08/20/2016   PLT 462 (H) 08/20/2016   LYMPHOPCT 8 (L) 07/13/2008   MONOPCT 0 (L) 07/13/2008   EOSPCT 0 07/13/2008   BASOPCT 0 07/13/2008    CMP: Lab  Results  Component Value Date   NA 135 08/20/2016   K 4.2 08/20/2016   CL 107 08/20/2016   CO2 25 08/20/2016   BUN 8 08/20/2016   CREATININE 0.54 08/20/2016   PROT 7.9 08/18/2016   ALBUMIN 3.7 08/18/2016   BILITOT 0.6 08/18/2016   ALKPHOS 57 08/18/2016   AST 29 08/18/2016   ALT 17 08/18/2016  . Results for SAYANI, SULKOWSKI (MRN [id number]) as of 08/20/2016 10:00  Ref. Range 08/18/2016 23:12 08/19/2016 07:59  Iron Latest Ref Range: 28 - 170 ug/dL 7 (L)   UIBC Latest Units: ug/dL 528   TIBC Latest Ref Range: 250 - 450 ug/dL 535 (H)   Saturation Ratios Latest Ref Range: 10.4 - 31.8 % 1 (L)   Ferritin Latest Ref Range: 11 - 307 ng/mL 3 (L)   Folate Latest Ref Range: >5.9 ng/mL  10.2    Total Time in preparing paper work, data evaluation and todays exam - 35 minutes  Lala Lund K M.D on 08/20/2016 at 10:24 AM  [facility]   Office  [phone]

## 2016-08-20 NOTE — Discharge Instructions (Signed)
Follow with Primary MD in 7 days   Get CBC, CMP, Iron panel checked  by Primary MD or SNF MD in 5-7 days ( we routinely change or add medications that can affect your baseline labs and fluid status, therefore we recommend that you get the mentioned basic workup next visit with your PCP, your PCP may decide not to get them or add new tests based on their clinical decision)   Activity: As tolerated with Full fall precautions use walker/cane & assistance as needed   Disposition Home     Diet:  Heart Healthy    For Heart failure patients - Check your Weight same time everyday, if you gain over 2 pounds, or you develop in leg swelling, experience more shortness of breath or chest pain, call your Primary MD immediately. Follow Cardiac Low Salt Diet and 1.5 lit/day fluid restriction.   On your next visit with your primary care physician please Get Medicines reviewed and adjusted.   Please request your Prim.MD to go over all Hospital Tests and Procedure/Radiological results at the follow up, please get all Hospital records sent to your Prim MD by signing hospital release before you go home.   If you experience worsening of your admission symptoms, develop shortness of breath, life threatening emergency, suicidal or homicidal thoughts you must seek medical attention immediately by calling 911 or calling your MD immediately  if symptoms less severe.  You Must read complete instructions/literature along with all the possible adverse reactions/side effects for all the Medicines you take and that have been prescribed to you. Take any new Medicines after you have completely understood and accpet all the possible adverse reactions/side effects.   Do not drive, operate heavy machinery, perform activities at heights, swimming or participation in water activities or provide baby sitting services if your were admitted for syncope or siezures until you have seen by Primary MD or a Neurologist and advised to do  so again.  Do not drive when taking Pain medications.    Do not take more than prescribed Pain, Sleep and Anxiety Medications  Special Instructions: If you have smoked or chewed Tobacco  in the last 2 yrs please stop smoking, stop any regular Alcohol  and or any Recreational drug use.  Wear Seat belts while driving.   Please note  You were cared for by a hospitalist during your hospital stay. If you have any questions about your discharge medications or the care you received while you were in the hospital after you are discharged, you can call the unit and asked to speak with the hospitalist on call if the hospitalist that took care of you is not available. Once you are discharged, your primary care physician will handle any further medical issues. Please note that NO REFILLS for any discharge medications will be authorized once you are discharged, as it is imperative that you return to your primary care physician (or establish a relationship with a primary care physician if you do not have one) for your aftercare needs so that they can reassess your need for medications and monitor your lab values.

## 2016-08-21 LAB — URINE CULTURE

## 2016-08-22 LAB — TYPE AND SCREEN
ABO/RH(D): A POS
Antibody Screen: NEGATIVE
UNIT DIVISION: 0
UNIT DIVISION: 0
UNIT DIVISION: 0
Unit division: 0

## 2016-12-27 ENCOUNTER — Encounter (HOSPITAL_COMMUNITY): Payer: Self-pay | Admitting: Emergency Medicine

## 2016-12-27 ENCOUNTER — Inpatient Hospital Stay (HOSPITAL_COMMUNITY)
Admission: EM | Admit: 2016-12-27 | Discharge: 2016-12-29 | DRG: 812 | Disposition: A | Discharge status: Home / Self Care | Payer: Medicaid - Out of State | Attending: Family Medicine | Admitting: Family Medicine

## 2016-12-27 DIAGNOSIS — R112 Nausea with vomiting, unspecified: Secondary | ICD-10-CM | POA: Diagnosis present

## 2016-12-27 DIAGNOSIS — G629 Polyneuropathy, unspecified: Secondary | ICD-10-CM | POA: Diagnosis present

## 2016-12-27 DIAGNOSIS — D500 Iron deficiency anemia secondary to blood loss (chronic): Secondary | ICD-10-CM | POA: Diagnosis not present

## 2016-12-27 DIAGNOSIS — D649 Anemia, unspecified: Secondary | ICD-10-CM | POA: Diagnosis present

## 2016-12-27 DIAGNOSIS — R740 Nonspecific elevation of levels of transaminase and lactic acid dehydrogenase [LDH]: Secondary | ICD-10-CM | POA: Diagnosis present

## 2016-12-27 DIAGNOSIS — N920 Excessive and frequent menstruation with regular cycle: Secondary | ICD-10-CM | POA: Diagnosis not present

## 2016-12-27 DIAGNOSIS — E876 Hypokalemia: Secondary | ICD-10-CM | POA: Diagnosis present

## 2016-12-27 DIAGNOSIS — F1021 Alcohol dependence, in remission: Secondary | ICD-10-CM | POA: Diagnosis present

## 2016-12-27 DIAGNOSIS — D696 Thrombocytopenia, unspecified: Secondary | ICD-10-CM | POA: Diagnosis present

## 2016-12-27 DIAGNOSIS — Z79899 Other long term (current) drug therapy: Secondary | ICD-10-CM

## 2016-12-27 DIAGNOSIS — R1110 Vomiting, unspecified: Secondary | ICD-10-CM

## 2016-12-27 LAB — CBC
HEMATOCRIT: 25.6 % — AB (ref 36.0–46.0)
HEMOGLOBIN: 6.8 g/dL — AB (ref 12.0–15.0)
MCH: 17.3 pg — ABNORMAL LOW (ref 26.0–34.0)
MCHC: 26.6 g/dL — ABNORMAL LOW (ref 30.0–36.0)
MCV: 65 fL — AB (ref 78.0–100.0)
Platelets: 53 10*3/uL — ABNORMAL LOW (ref 150–400)
RBC: 3.94 MIL/uL (ref 3.87–5.11)
RDW: 19.3 % — AB (ref 11.5–15.5)
WBC: 7.3 10*3/uL (ref 4.0–10.5)

## 2016-12-27 LAB — URINALYSIS, ROUTINE W REFLEX MICROSCOPIC
BACTERIA UA: NONE SEEN
BILIRUBIN URINE: NEGATIVE
Glucose, UA: NEGATIVE mg/dL
KETONES UR: 5 mg/dL — AB
LEUKOCYTES UA: NEGATIVE
Nitrite: NEGATIVE
PROTEIN: 100 mg/dL — AB
Specific Gravity, Urine: 1.026 (ref 1.005–1.030)
pH: 6 (ref 5.0–8.0)

## 2016-12-27 LAB — PREPARE RBC (CROSSMATCH)

## 2016-12-27 LAB — COMPREHENSIVE METABOLIC PANEL
ALT: 64 U/L — ABNORMAL HIGH (ref 14–54)
AST: 128 U/L — AB (ref 15–41)
Albumin: 4.2 g/dL (ref 3.5–5.0)
Alkaline Phosphatase: 82 U/L (ref 38–126)
Anion gap: 12 (ref 5–15)
BUN: 13 mg/dL (ref 6–20)
CHLORIDE: 102 mmol/L (ref 101–111)
CO2: 25 mmol/L (ref 22–32)
Calcium: 9.2 mg/dL (ref 8.9–10.3)
Creatinine, Ser: 0.64 mg/dL (ref 0.44–1.00)
GFR calc Af Amer: >60 mL/min (ref >60)
Glucose, Bld: 116 mg/dL — ABNORMAL HIGH (ref 65–99)
POTASSIUM: 3.4 mmol/L — AB (ref 3.5–5.1)
Sodium: 139 mmol/L (ref 135–145)
Total Bilirubin: 1.5 mg/dL — ABNORMAL HIGH (ref 0.3–1.2)
Total Protein: 8.9 g/dL — ABNORMAL HIGH (ref 6.5–8.1)

## 2016-12-27 LAB — I-STAT TROPONIN, ED: TROPONIN I, POC: 0 ng/mL (ref 0.00–0.08)

## 2016-12-27 LAB — ETHANOL

## 2016-12-27 LAB — I-STAT BETA HCG BLOOD, ED (MC, WL, AP ONLY): I-stat hCG, quantitative: <5 m[IU]/mL (ref <5)

## 2016-12-27 LAB — LIPASE, BLOOD: LIPASE: 30 U/L (ref 11–51)

## 2016-12-27 MED ORDER — ONDANSETRON HCL 4 MG/2ML IJ SOLN
4.0000 mg | Freq: Four times a day (QID) | INTRAMUSCULAR | Status: DC | PRN
  Administered 2016-12-27 – 2016-12-28 (×2): 4 mg via INTRAVENOUS
  Filled 2016-12-27 (×2): qty 2

## 2016-12-27 MED ORDER — MORPHINE SULFATE (PF) 4 MG/ML IV SOLN
4.0000 mg | Freq: Once | INTRAVENOUS | Status: AC
  Administered 2016-12-27: 4 mg via INTRAVENOUS
  Filled 2016-12-27: qty 1

## 2016-12-27 MED ORDER — ONDANSETRON HCL 4 MG/2ML IJ SOLN
4.0000 mg | Freq: Once | INTRAMUSCULAR | Status: AC | PRN
  Administered 2016-12-27: 4 mg via INTRAVENOUS
  Filled 2016-12-27: qty 2

## 2016-12-27 MED ORDER — MORPHINE SULFATE (PF) 2 MG/ML IV SOLN
1.0000 mg | Freq: Once | INTRAVENOUS | Status: AC
  Administered 2016-12-27: 1 mg via INTRAVENOUS
  Filled 2016-12-27: qty 1

## 2016-12-27 MED ORDER — ACETAMINOPHEN 325 MG PO TABS
ORAL_TABLET | ORAL | Status: AC
  Administered 2016-12-27: 650 mg
  Filled 2016-12-27: qty 2

## 2016-12-27 MED ORDER — ONDANSETRON HCL 4 MG PO TABS: 4.0000 mg | ORAL_TABLET | Freq: Four times a day (QID) | ORAL | Status: DC | PRN
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
## 2016-12-27 NOTE — ED Triage Notes (Signed)
Patient c/o generalized body aches with nausea and vomiting x1 week. Denies any diarrhea or fevers. Last B/M 2 days ago-normal-no blood noted. Patient reports stinging and burning in feet bilateral. Per patient tremors from pain and dizziness.
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
## 2016-12-27 NOTE — ED Notes (Signed)
Pt reports weakness x one week with sob with exertion. Reports the feeling of heart racing with exertion. Pt started vomiting last night. Noted to be very shaky and reports bilateral feet pain

## 2016-12-27 NOTE — ED Notes (Signed)
CRITICAL VALUE ALERT  Critical value received:  Hemoglobin 6.8  Date of notification 12/27/16  Time of notification:  1200  Critical value read back:yes  Nurse who received alert:  Angelina Pih RN  MD notified (1st page): Dr Cathleen Fears  Time of first page:  1200

## 2016-12-27 NOTE — ED Notes (Signed)
Pt ambulated to BR with assistance.

## 2016-12-27 NOTE — ED Notes (Signed)
Patient walked to the bathroom with minimal assistance.  

## 2016-12-28 ENCOUNTER — Observation Stay (HOSPITAL_COMMUNITY): Payer: Medicaid - Out of State

## 2016-12-28 DIAGNOSIS — D500 Iron deficiency anemia secondary to blood loss (chronic): Secondary | ICD-10-CM | POA: Diagnosis not present

## 2016-12-28 DIAGNOSIS — D649 Anemia, unspecified: Secondary | ICD-10-CM | POA: Diagnosis not present

## 2016-12-28 DIAGNOSIS — N920 Excessive and frequent menstruation with regular cycle: Secondary | ICD-10-CM | POA: Diagnosis not present

## 2016-12-28 DIAGNOSIS — R112 Nausea with vomiting, unspecified: Secondary | ICD-10-CM | POA: Diagnosis not present

## 2016-12-28 LAB — BPAM RBC
BLOOD PRODUCT EXPIRATION DATE: 201805212359
Blood Product Expiration Date: 201805212359
ISSUE DATE / TIME: 201805202032
ISSUE DATE / TIME: 201805201352
Unit Type and Rh: 600
Unit Type and Rh: 600

## 2016-12-28 LAB — COMPREHENSIVE METABOLIC PANEL
ALK PHOS: 64 U/L (ref 38–126)
ALT: 42 U/L (ref 14–54)
AST: 63 U/L — AB (ref 15–41)
Albumin: 3.5 g/dL (ref 3.5–5.0)
Anion gap: 8 (ref 5–15)
BUN: 15 mg/dL (ref 6–20)
CALCIUM: 8.5 mg/dL — AB (ref 8.9–10.3)
CO2: 29 mmol/L (ref 22–32)
CREATININE: 0.57 mg/dL (ref 0.44–1.00)
Chloride: 97 mmol/L — ABNORMAL LOW (ref 101–111)
Glucose, Bld: 104 mg/dL — ABNORMAL HIGH (ref 65–99)
Potassium: 3.1 mmol/L — ABNORMAL LOW (ref 3.5–5.1)
Sodium: 134 mmol/L — ABNORMAL LOW (ref 135–145)
Total Bilirubin: 1.8 mg/dL — ABNORMAL HIGH (ref 0.3–1.2)
Total Protein: 7.4 g/dL (ref 6.5–8.1)

## 2016-12-28 LAB — FOLATE: FOLATE: 18 ng/mL (ref >5.9)

## 2016-12-28 LAB — TYPE AND SCREEN
ABO/RH(D): A POS
ANTIBODY SCREEN: NEGATIVE
UNIT DIVISION: 0
UNIT DIVISION: 0

## 2016-12-28 LAB — IRON AND TIBC
Iron: 74 ug/dL (ref 28–170)
SATURATION RATIOS: 15 % (ref 10.4–31.8)
TIBC: 496 ug/dL — ABNORMAL HIGH (ref 250–450)
UIBC: 422 ug/dL

## 2016-12-28 LAB — RAPID URINE DRUG SCREEN, HOSP PERFORMED
Amphetamines: NOT DETECTED
BARBITURATES: NOT DETECTED
BENZODIAZEPINES: NOT DETECTED
Cocaine: NOT DETECTED
Opiates: NOT DETECTED
Tetrahydrocannabinol: NOT DETECTED

## 2016-12-28 LAB — CBC
HCT: 31.3 % — ABNORMAL LOW (ref 36.0–46.0)
HEMOGLOBIN: 9.1 g/dL — AB (ref 12.0–15.0)
MCH: 20 pg — AB (ref 26.0–34.0)
MCHC: 29.1 g/dL — ABNORMAL LOW (ref 30.0–36.0)
MCV: 68.8 fL — ABNORMAL LOW (ref 78.0–100.0)
Platelets: 48 10*3/uL — ABNORMAL LOW (ref 150–400)
RBC: 4.55 MIL/uL (ref 3.87–5.11)
RDW: 21 % — ABNORMAL HIGH (ref 11.5–15.5)
WBC: 4.5 10*3/uL (ref 4.0–10.5)

## 2016-12-28 LAB — RETICULOCYTES
RBC.: 4.57 MIL/uL (ref 3.87–5.11)
Retic Count, Absolute: 50.3 10*3/uL (ref 19.0–186.0)
Retic Ct Pct: 1.1 % (ref 0.4–3.1)

## 2016-12-28 LAB — PLATELET FUNCTION ASSAY
COLLAGEN / ADP: 144 s — AB (ref 0–118)
Collagen / Epinephrine: 219 seconds — ABNORMAL HIGH (ref 0–193)

## 2016-12-28 LAB — SAVE SMEAR

## 2016-12-28 LAB — VITAMIN B12: Vitamin B-12: 449 pg/mL (ref 180–914)

## 2016-12-28 LAB — FERRITIN: Ferritin: 19 ng/mL (ref 11–307)

## 2016-12-28 MED ORDER — ACETAMINOPHEN 500 MG PO TABS
500.0000 mg | ORAL_TABLET | Freq: Once | ORAL | Status: AC
Start: 2016-12-28 — End: 2016-12-28
  Administered 2016-12-28: 500 mg via ORAL
  Filled 2016-12-28: qty 1

## 2016-12-28 MED ORDER — POTASSIUM CHLORIDE 10 MEQ/100ML IV SOLN
10.0000 meq | INTRAVENOUS | Status: AC
  Administered 2016-12-28 (×3): 10 meq via INTRAVENOUS
  Filled 2016-12-28: qty 100

## 2016-12-28 MED ORDER — IBUPROFEN 600 MG PO TABS
600.0000 mg | ORAL_TABLET | Freq: Once | ORAL | Status: AC
  Administered 2016-12-28: 600 mg via ORAL
  Filled 2016-12-28: qty 1

## 2016-12-28 MED ORDER — PROMETHAZINE HCL 25 MG/ML IJ SOLN: 25.0000 mg | Freq: Four times a day (QID) | INTRAMUSCULAR | Status: DC | PRN

## 2016-12-28 MED ORDER — ZOLPIDEM TARTRATE 5 MG PO TABS
5.0000 mg | ORAL_TABLET | Freq: Once | ORAL | Status: AC
Start: 2016-12-28 — End: 2016-12-28
  Administered 2016-12-28: 5 mg via ORAL
  Filled 2016-12-28: qty 1

## 2016-12-28 NOTE — Consult Note (Signed)
Error

## 2016-12-28 NOTE — Progress Notes (Signed)
PROGRESS NOTE    Brittney Melendez  WUJ:[id number] DOB: 16-Apr-1976 DOA: 12/27/2016 PCP: Patient, No Pcp Per    Brief Narrative:  Brittney Melendez is a 41 y.o. female with medical history significant of menorrhagia, iron deficiency anemia, hypokalemia that presented to the ED with concerns of N and V as well as bilateral feet burning and numbness.  Patient reports this has been going on for a few days prior to coming to the hospital but that she had this in the past when her blood count was low.  Upon review of the chart patient was admitted in January for 2 days for anemia which was attributed to her heavy menstrual periods.  She was instructed to follow up with GYN outpatient but cites that due to insurance issues she could not.  She voices that she has had numerous family members that have have had heavy menses and required hysterectomies because of this.  She denies any family history of bleeding disorders.  She denies any medications besides supplements outpatient.  She denies any chronic medical problems besides anemia.  She denies chest pain but voices she has felt short of breath ambulating.  She also states she has had significant nausea and vomiting prior to coming to the ED which started last evening.  Voiced that she vomited about 7 times.  She denies blood in emesis.  Denies hematuria, hematochezia, hematemesis. Gets monthly periods that are very heavy sometimes requiring changing pads within 1 hour.    Assessment & Plan:   Principal Problem:   Symptomatic anemia Active Problems:   Menorrhagia   Hypokalemia   Nausea & vomiting   Anemia   Symptomatic anemia - transfusing 1 unit PRBC - H/H improved appropriately following transfusion - Hematology consulted - needs referral to GYN outpatient - CBC in am  Thrombocytopenia - unclear etiology - previously WNL in January of this year - Hematology consulted - platelet assay showing platelet dysfunction ? Von Willebrand  disease  Menorrhagia - patient reports that her periods have stayed the same as previous - wants repeat referral to gyn outpatient  Hypokalemia - potassium at 3.1this am - IV replacement  Nausea and vomiting - zofran and phenergan - clear liquid diet and advance as tolerated  Elevated LFTs - negative abdominal ultrasound - ? H/o alcohol consumption   DVT prophylaxis: SCDs  Code Status:  Full Code Family Communication:  No family bedside  Disposition Plan: Likely discharge    Consultants:   Hematology  Procedures:   None  Antimicrobials:   None    Subjective: Patient states that she feels very sick to her stomach.  Did not tolerate any breakfast.  Still having occasional pain in her feet.   Objective: Vitals:   12/27/16 2100 12/27/16 2350 12/28/16 0642 12/28/16 1407  BP: (!) 142/84 (!) 147/85 100/90 134/78  Pulse: 73 70 78 81  Resp: 16 14 14 16   Temp: 98.3 F (36.8 C) 98.5 F (36.9 C) 99.1 F (37.3 C) 98.8 F (37.1 C)  TempSrc: Oral Oral Oral Oral  SpO2: 98% 100% 100% 98%  Weight:      Height:        Intake/Output Summary (Last 24 hours) at 12/28/16 1609 Last data filed at 12/28/16 1408  Gross per 24 hour  Intake             1665 ml  Output              800 ml  Net  865 ml   Filed Weights   12/27/16 1117 12/27/16 1552  Weight: 73.9 kg (163 lb) 70.2 kg (154 lb 12.2 oz)    Examination:  General exam: Appears calm and comfortable  Respiratory system: Clear to auscultation. Respiratory effort normal. Cardiovascular system: S1 & S2 heard, RRR. No JVD, murmurs, rubs, gallops or clicks. No pedal edema. Gastrointestinal system: Abdomen is nondistended, soft and nontender. No organomegaly or masses felt. Normal bowel sounds heard. Central nervous system: Alert and oriented. No focal neurological deficits. Extremities: Symmetric 5 x 5 power. Skin: No rashes, lesions or ulcers Psychiatry: Judgement and insight appear normal. Mood &  affect appropriate.     Data Reviewed: I have personally reviewed following labs and imaging studies  CBC:  Recent Labs Lab 12/27/16 1121 12/28/16 0409  WBC 7.3 4.5  HGB 6.8* 9.1*  HCT 25.6* 31.3*  MCV 65.0* 68.8*  PLT 53* 48*   Basic Metabolic Panel:  Recent Labs Lab 12/27/16 1121 12/28/16 0409  NA 139 134*  K 3.4* 3.1*  CL 102 97*  CO2 25 29  GLUCOSE 116* 104*  BUN 13 15  CREATININE 0.64 0.57  CALCIUM 9.2 8.5*   GFR: Estimated Creatinine Clearance: 89.1 mL/min (by C-G formula based on SCr of 0.57 mg/dL). Liver Function Tests:  Recent Labs Lab 12/27/16 1121 12/28/16 0409  AST 128* 63*  ALT 64* 42  ALKPHOS 82 64  BILITOT 1.5* 1.8*  PROT 8.9* 7.4  ALBUMIN 4.2 3.5    Recent Labs Lab 12/27/16 1121  LIPASE 30   No results for input(s): AMMONIA in the last 168 hours. Coagulation Profile: No results for input(s): INR, PROTIME in the last 168 hours. Cardiac Enzymes: No results for input(s): CKTOTAL, CKMB, CKMBINDEX, TROPONINI in the last 168 hours. BNP (last 3 results) No results for input(s): PROBNP in the last 8760 hours. HbA1C: No results for input(s): HGBA1C in the last 72 hours. CBG: No results for input(s): GLUCAP in the last 168 hours. Lipid Profile: No results for input(s): CHOL, HDL, LDLCALC, TRIG, CHOLHDL, LDLDIRECT in the last 72 hours. Thyroid Function Tests: No results for input(s): TSH, T4TOTAL, FREET4, T3FREE, THYROIDAB in the last 72 hours. Anemia Panel:  Recent Labs  12/28/16 1456  RETICCTPCT 1.1   Sepsis Labs: No results for input(s): PROCALCITON, LATICACIDVEN in the last 168 hours.  No results found for this or any previous visit (from the past 240 hour(s)).       Radiology Studies: US Abdomen Complete  Result Date: 12/28/2016 CLINICAL DATA:  Nausea/ vomiting x3 days EXAM: ABDOMEN ULTRASOUND COMPLETE COMPARISON:  None. FINDINGS: Gallbladder: No gallstones, gallbladder wall thickening, or pericholecystic fluid.  Negative sonographic Murphy's sign. Common bile duct: Diameter: 3 mm Liver: No focal lesion identified. Within normal limits in parenchymal echogenicity. IVC: No abnormality visualized. Pancreas: Visualized portion unremarkable. Spleen: Size and appearance within normal limits. Right Kidney: Length: 12.6 cm.  No mass or hydronephrosis. Left Kidney: Length: 11.6 cm.  No mass or hydronephrosis. Abdominal aorta: No aneurysm visualized. Other findings: None. IMPRESSION: Negative abdominal ultrasound. Electronically Signed   By: Julian Hy M.D.   On: 12/28/2016 11:39        Scheduled Meds: Continuous Infusions:   LOS: 0 days    Time spent: 30 minutes    Loretha Stapler, MD [facility] Pager [phone]  If 7PM-7AM, please contact night-coverage [email or web address] Password [email or web address] 12/28/2016, 4:09 PM
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
## 2016-12-28 NOTE — Progress Notes (Signed)
Patient complains of nausea and vomiting.  Only a small amount of water seen in emesis basin.

## 2016-12-29 DIAGNOSIS — D696 Thrombocytopenia, unspecified: Secondary | ICD-10-CM

## 2016-12-29 DIAGNOSIS — D500 Iron deficiency anemia secondary to blood loss (chronic): Secondary | ICD-10-CM | POA: Diagnosis not present

## 2016-12-29 DIAGNOSIS — N920 Excessive and frequent menstruation with regular cycle: Secondary | ICD-10-CM | POA: Diagnosis not present

## 2016-12-29 DIAGNOSIS — E876 Hypokalemia: Secondary | ICD-10-CM

## 2016-12-29 DIAGNOSIS — D649 Anemia, unspecified: Secondary | ICD-10-CM | POA: Diagnosis not present

## 2016-12-29 LAB — BASIC METABOLIC PANEL
Anion gap: 8 (ref 5–15)
BUN: 13 mg/dL (ref 6–20)
CHLORIDE: 98 mmol/L — AB (ref 101–111)
CO2: 28 mmol/L (ref 22–32)
Calcium: 9 mg/dL (ref 8.9–10.3)
Creatinine, Ser: 0.62 mg/dL (ref 0.44–1.00)
GFR calc Af Amer: >60 mL/min (ref >60)
GFR calc non Af Amer: >60 mL/min (ref >60)
Glucose, Bld: 96 mg/dL (ref 65–99)
POTASSIUM: 3.5 mmol/L (ref 3.5–5.1)
SODIUM: 134 mmol/L — AB (ref 135–145)

## 2016-12-29 LAB — CBC WITH DIFFERENTIAL/PLATELET
Basophils Absolute: 0 10*3/uL (ref 0.0–0.1)
Basophils Relative: 0 %
Eosinophils Absolute: 0.1 10*3/uL (ref 0.0–0.7)
Eosinophils Relative: 1 %
HEMATOCRIT: 33.5 % — AB (ref 36.0–46.0)
HEMOGLOBIN: 9.7 g/dL — AB (ref 12.0–15.0)
LYMPHS ABS: 0.8 10*3/uL (ref 0.7–4.0)
LYMPHS PCT: 12 %
MCH: 20 pg — AB (ref 26.0–34.0)
MCHC: 29 g/dL — ABNORMAL LOW (ref 30.0–36.0)
MCV: 69.1 fL — AB (ref 78.0–100.0)
MONOS PCT: 6 %
Monocytes Absolute: 0.4 10*3/uL (ref 0.1–1.0)
NEUTROS ABS: 5.1 10*3/uL (ref 1.7–7.7)
Neutrophils Relative %: 80 %
Platelets: 70 10*3/uL — ABNORMAL LOW (ref 150–400)
RBC: 4.85 MIL/uL (ref 3.87–5.11)
RDW: 21.9 % — ABNORMAL HIGH (ref 11.5–15.5)
WBC: 6.4 10*3/uL (ref 4.0–10.5)

## 2016-12-29 LAB — PATHOLOGIST SMEAR REVIEW

## 2016-12-29 LAB — HIV ANTIBODY (ROUTINE TESTING W REFLEX): HIV SCREEN 4TH GENERATION: NONREACTIVE

## 2016-12-29 MED ORDER — ESCITALOPRAM OXALATE 10 MG PO TABS: 10.0000 mg | ORAL_TABLET | Freq: Every day | ORAL | 0 refills | Status: DC

## 2016-12-29 MED ORDER — IBUPROFEN 400 MG PO TABS
400.0000 mg | ORAL_TABLET | Freq: Three times a day (TID) | ORAL | Status: DC
  Administered 2016-12-29: 400 mg via ORAL
  Filled 2016-12-29: qty 1

## 2016-12-29 MED ORDER — FERROUS SULFATE 325 (65 FE) MG PO TABS: 325.0000 mg | ORAL_TABLET | Freq: Three times a day (TID) | ORAL | 0 refills | Status: DC

## 2016-12-29 NOTE — Care Management Note (Signed)
Case Management Note  Patient Details  Name: Brittney Melendez MRN: [id number] Date of Birth: 08/14/1975  Subjective/Objective:                  Pt admitted with symptomatic anemia. She is from home with family. She is ind with ADL's. She has [employer] medicaid but no PCP. Pt will need OB/GYN follow up at DC. Pt lives in [place]. Pt given list of PCP's and OBGYN's to choose from. She understands it is her responsibility to contact providers to establish care. No other needs communicated.   Action/Plan: Pt will DC home today. No CM needs anticipated. CM will follow to DC.   Expected Discharge Date:    12/30/2016              Expected Discharge Plan:  Home/Self Care  In-House Referral:  NA  Discharge planning Services  CM Consult  Post Acute Care Choice:  NA Choice offered to:  NA  Status of Service:  Completed, signed off Sherald Barge, RN 12/29/2016, 10:44 AM

## 2016-12-29 NOTE — Discharge Summary (Signed)
Physician Discharge Summary  Brittney Melendez YBO:[id number] DOB: 1976-01-07 DOA: 12/27/2016  PCP: Patient, No Pcp Per  Admit date: 12/27/2016 Discharge date: 12/29/2016  Admitted From: Home Disposition:   Home   Recommendations for Outpatient Follow-up:  1. Get set up with a PCP within 2-3 weeks 2. Get follow up with a GYN in [place] 3. Take prescriptions as prescribed 4. Please follow up on the following pending results: ADAMSTS 13 activity, von Willebrand Panel  Home Health: No Equipment/Devices:None   Discharge Condition:Stable CODE STATUS: Full   Diet recommendation: Regular  Brief/Interim Summary: Brittney L Cardwellis a 41 y.o.femalewith medical history significant of menorrhagia, iron deficiency anemia, hypokalemia that presented to the ED with concerns of N and V as well as bilateral feet burning and numbness. Patient reports this has been going on for a few days prior to coming to the hospital but that she had this in the past when her blood count was low. Upon review of the chart patient was admitted in January for 2 days for anemia which was attributed to her heavy menstrual periods. She was instructed to follow up with GYN outpatient but cites that due to insurance issues she could not. She voices that she has had numerous family members that have have had heavy menses and required hysterectomies because of this. She denies any family history of bleeding disorders. She denies any medications besides supplements outpatient. She denies any chronic medical problems besides anemia. She denies chest pain but voices she has felt short of breath ambulating. She also states she has had significant nausea and vomiting prior to coming to the ED which started last evening. Voiced that she vomited about 7 times. She denies blood in emesis. Denies hematuria, hematochezia, hematemesis. Gets monthly periods that are very heavy sometimes requiring changing pads within 1 hour.  She  was admitted to the hospital and transfused.  Her platelets were found to be low (previously normal 4 month ago) and she was found to have elevated LFT's.  She underwent abdominal ultrasound which was unrevealing.  Hematology consulted for thrombocytopenia and anemia. Von Willebrand panel ordered. Patient H/H and vital signs were stable on day of discharge and she was instructed to establish care with a PCP outpatient.   Discharge Diagnoses:  Principal Problem:   Symptomatic anemia Active Problems:   Menorrhagia   Hypokalemia   Nausea & vomiting   Anemia    Discharge Instructions  Discharge Instructions    Call MD for:  difficulty breathing, headache or visual disturbances    Complete by:  As directed    Call MD for:  extreme fatigue    Complete by:  As directed    Call MD for:  hives    Complete by:  As directed    Call MD for:  persistant dizziness or light-headedness    Complete by:  As directed    Call MD for:  persistant nausea and vomiting    Complete by:  As directed    Call MD for:  severe uncontrolled pain    Complete by:  As directed    Call MD for:  temperature >100.4    Complete by:  As directed    Diet general    Complete by:  As directed    Increase activity slowly    Complete by:  As directed      Allergies as of 12/29/2016      Reactions   Rocephin [ceftriaxone] Rash   Sulfa Antibiotics Itching  Medication List    STOP taking these medications   pantoprazole 40 MG tablet Commonly known as:  PROTONIX     TAKE these medications   escitalopram 10 MG tablet Commonly known as:  LEXAPRO Take 1 tablet (10 mg total) by mouth daily.   ferrous sulfate 325 (65 FE) MG tablet Take 1 tablet (325 mg total) by mouth 3 (three) times daily with meals.   MULTIVITAMIN/IRON PO Take 1 tablet by mouth daily.   POTASSIUM PO Take 1 tablet by mouth daily.      Follow-up Information    [facility], PA-C. Schedule an appointment as soon as possible for  a visit in 3 week(s).   Specialty:  Oncology Contact information: [place] [address] [phone]          Allergies  Allergen Reactions  . Rocephin [Ceftriaxone] Rash  . Sulfa Antibiotics Itching    Consultations:  Hematology   Procedures/Studies: US Abdomen Complete  Result Date: 12/28/2016 CLINICAL DATA:  Nausea/ vomiting x3 days EXAM: ABDOMEN ULTRASOUND COMPLETE COMPARISON:  None. FINDINGS: Gallbladder: No gallstones, gallbladder wall thickening, or pericholecystic fluid. Negative sonographic Murphy's sign. Common bile duct: Diameter: 3 mm Liver: No focal lesion identified. Within normal limits in parenchymal echogenicity. IVC: No abnormality visualized. Pancreas: Visualized portion unremarkable. Spleen: Size and appearance within normal limits. Right Kidney: Length: 12.6 cm.  No mass or hydronephrosis. Left Kidney: Length: 11.6 cm.  No mass or hydronephrosis. Abdominal aorta: No aneurysm visualized. Other findings: None. IMPRESSION: Negative abdominal ultrasound. Electronically Signed   By: Julian Hy M.D.   On: 12/28/2016 11:39       Subjective: Patient feels better today- able to ambulate and eat without problem.  Understands she will need to follow up with GYN outpatient as well as find a PCP.  Discharge Exam: Vitals:   12/28/16 2107 12/29/16 0652  BP: 132/75 129/87  Pulse: 87 70  Resp: 18 18  Temp: 98.9 F (37.2 C) 98.5 F (36.9 C)   Vitals:   12/28/16 0642 12/28/16 1407 12/28/16 2107 12/29/16 0652  BP: 100/90 134/78 132/75 129/87  Pulse: 78 81 87 70  Resp: 14 16 18 18   Temp: 99.1 F (37.3 C) 98.8 F (37.1 C) 98.9 F (37.2 C) 98.5 F (36.9 C)  TempSrc: Oral Oral Oral Oral  SpO2: 100% 98% 100% 100%  Weight:      Height:        General: Pt is alert, awake, not in acute distress Cardiovascular: RRR, S1/S2 +, no rubs, no gallops Respiratory: CTA bilaterally, no wheezing, no rhonchi Abdominal: Soft, NT, ND, bowel sounds  + Extremities: no edema, no cyanosis    The results of significant diagnostics from this hospitalization (including imaging, microbiology, ancillary and laboratory) are listed below for reference.     Microbiology: No results found for this or any previous visit (from the past 240 hour(s)).   Labs: BNP (last 3 results) No results for input(s): BNP in the last 8760 hours. Basic Metabolic Panel:  Recent Labs Lab 12/27/16 1121 12/28/16 0409 12/29/16 1106  NA 139 134* 134*  K 3.4* 3.1* 3.5  CL 102 97* 98*  CO2 25 29 28   GLUCOSE 116* 104* 96  BUN 13 15 13   CREATININE 0.64 0.57 0.62  CALCIUM 9.2 8.5* 9.0   Liver Function Tests:  Recent Labs Lab 12/27/16 1121 12/28/16 0409  AST 128* 63*  ALT 64* 42  ALKPHOS 82 64  BILITOT 1.5* 1.8*  PROT  8.9* 7.4  ALBUMIN 4.2 3.5    Recent Labs Lab 12/27/16 1121  LIPASE 30   No results for input(s): AMMONIA in the last 168 hours. CBC:  Recent Labs Lab 12/27/16 1121 12/28/16 0409 12/29/16 1106  WBC 7.3 4.5 6.4  NEUTROABS  --   --  5.1  HGB 6.8* 9.1* 9.7*  HCT 25.6* 31.3* 33.5*  MCV 65.0* 68.8* 69.1*  PLT 53* 48* 70*   Cardiac Enzymes: No results for input(s): CKTOTAL, CKMB, CKMBINDEX, TROPONINI in the last 168 hours. BNP: Invalid input(s): POCBNP CBG: No results for input(s): GLUCAP in the last 168 hours. D-Dimer No results for input(s): DDIMER in the last 72 hours. Hgb A1c No results for input(s): HGBA1C in the last 72 hours. Lipid Profile No results for input(s): CHOL, HDL, LDLCALC, TRIG, CHOLHDL, LDLDIRECT in the last 72 hours. Thyroid function studies No results for input(s): TSH, T4TOTAL, T3FREE, THYROIDAB in the last 72 hours.  Invalid input(s): FREET3 Anemia work up  Recent Labs  12/28/16 1456  VITAMINB12 449  FOLATE 18.0  FERRITIN 19  TIBC 496*  IRON 74  RETICCTPCT 1.1   Urinalysis    Component Value Date/Time   COLORURINE AMBER (A) 12/27/2016 1211   APPEARANCEUR HAZY (A) 12/27/2016 1211    LABSPEC 1.026 12/27/2016 1211   PHURINE 6.0 12/27/2016 1211   GLUCOSEU NEGATIVE 12/27/2016 1211   HGBUR SMALL (A) 12/27/2016 1211   BILIRUBINUR NEGATIVE 12/27/2016 1211   KETONESUR 5 (A) 12/27/2016 1211   PROTEINUR 100 (A) 12/27/2016 1211   UROBILINOGEN 1.0 07/13/2008 0830   NITRITE NEGATIVE 12/27/2016 1211   LEUKOCYTESUR NEGATIVE 12/27/2016 1211   Sepsis Labs Invalid input(s): PROCALCITONIN,  WBC,  LACTICIDVEN Microbiology No results found for this or any previous visit (from the past 240 hour(s)).   Time coordinating discharge: 40 minutes  SIGNED:   Loretha Stapler, MD  [facility] 12/29/2016, 1:33 PM Pager [phone] If 7PM-7AM, please contact night-coverage [email or web address] Password [email or web address]

## 2016-12-30 LAB — ADAMTS13 ACTIVITY: Adamts 13 Activity: 97.2 % (ref >66.8)

## 2016-12-30 LAB — HEPATITIS PANEL, ACUTE
HCV Ab: 0.1 s/co ratio (ref 0.0–0.9)
HEP B S AG: NEGATIVE
Hep A IgM: NEGATIVE
Hep B C IgM: NEGATIVE

## 2016-12-30 LAB — ADAMTS13 ACTIVITY REFLEX

## 2017-01-07 LAB — VON WILLEBRAND FACTOR MULTIMER

## 2017-03-21 ENCOUNTER — Encounter (HOSPITAL_COMMUNITY): Payer: Self-pay | Admitting: Emergency Medicine

## 2017-03-21 ENCOUNTER — Emergency Department (HOSPITAL_COMMUNITY): Payer: Medicaid - Out of State

## 2017-03-21 ENCOUNTER — Emergency Department (HOSPITAL_COMMUNITY)
Admission: EM | Admit: 2017-03-21 | Discharge: 2017-03-21 | Disposition: A | Discharge status: Home / Self Care | Payer: Medicaid - Out of State | Attending: Emergency Medicine | Admitting: Emergency Medicine

## 2017-03-21 DIAGNOSIS — D500 Iron deficiency anemia secondary to blood loss (chronic): Secondary | ICD-10-CM | POA: Insufficient documentation

## 2017-03-21 DIAGNOSIS — Z79899 Other long term (current) drug therapy: Secondary | ICD-10-CM | POA: Diagnosis not present

## 2017-03-21 DIAGNOSIS — E876 Hypokalemia: Secondary | ICD-10-CM | POA: Diagnosis not present

## 2017-03-21 DIAGNOSIS — Z7982 Long term (current) use of aspirin: Secondary | ICD-10-CM | POA: Diagnosis not present

## 2017-03-21 DIAGNOSIS — R079 Chest pain, unspecified: Secondary | ICD-10-CM | POA: Diagnosis present

## 2017-03-21 HISTORY — DX: Cardiac murmur, unspecified: R01.1

## 2017-03-21 LAB — COMPREHENSIVE METABOLIC PANEL
ALBUMIN: 3.8 g/dL (ref 3.5–5.0)
ALK PHOS: 78 U/L (ref 38–126)
ALT: 82 U/L — AB (ref 14–54)
AST: 225 U/L — AB (ref 15–41)
Anion gap: 10 (ref 5–15)
BILIRUBIN TOTAL: 0.6 mg/dL (ref 0.3–1.2)
BUN: 9 mg/dL (ref 6–20)
CALCIUM: 8.6 mg/dL — AB (ref 8.9–10.3)
CO2: 26 mmol/L (ref 22–32)
CREATININE: 0.56 mg/dL (ref 0.44–1.00)
Chloride: 104 mmol/L (ref 101–111)
GFR calc Af Amer: >60 mL/min (ref >60)
GLUCOSE: 148 mg/dL — AB (ref 65–99)
POTASSIUM: 3.3 mmol/L — AB (ref 3.5–5.1)
Sodium: 140 mmol/L (ref 135–145)
TOTAL PROTEIN: 7.9 g/dL (ref 6.5–8.1)

## 2017-03-21 LAB — TROPONIN I: Troponin I: <0.03 ng/mL (ref <0.03)

## 2017-03-21 LAB — TYPE AND SCREEN
ABO/RH(D): A POS
Antibody Screen: NEGATIVE

## 2017-03-21 LAB — CBC
HEMATOCRIT: 29.3 % — AB (ref 36.0–46.0)
Hemoglobin: 8.8 g/dL — ABNORMAL LOW (ref 12.0–15.0)
MCH: 22.9 pg — ABNORMAL LOW (ref 26.0–34.0)
MCHC: 30 g/dL (ref 30.0–36.0)
MCV: 76.1 fL — AB (ref 78.0–100.0)
PLATELETS: 84 10*3/uL — AB (ref 150–400)
RBC: 3.85 MIL/uL — ABNORMAL LOW (ref 3.87–5.11)
RDW: 16.1 % — AB (ref 11.5–15.5)
WBC: 3.7 10*3/uL — AB (ref 4.0–10.5)

## 2017-03-21 MED ORDER — IBUPROFEN 800 MG PO TABS
800.0000 mg | ORAL_TABLET | Freq: Once | ORAL | Status: AC
  Administered 2017-03-21: 800 mg via ORAL
  Filled 2017-03-21: qty 1

## 2017-03-21 MED ORDER — SODIUM CHLORIDE 0.9 % IV BOLUS (SEPSIS)
1000.0000 mL | Freq: Once | INTRAVENOUS | Status: AC
  Administered 2017-03-21: 1000 mL via INTRAVENOUS

## 2017-03-21 MED ORDER — POTASSIUM CHLORIDE CRYS ER 20 MEQ PO TBCR
40.0000 meq | EXTENDED_RELEASE_TABLET | Freq: Once | ORAL | Status: AC
  Administered 2017-03-21: 40 meq via ORAL
  Filled 2017-03-21: qty 2

## 2017-03-21 MED ORDER — POTASSIUM CHLORIDE 10 MEQ/100ML IV SOLN
10.0000 meq | Freq: Once | INTRAVENOUS | Status: AC
  Administered 2017-03-21: 10 meq via INTRAVENOUS
  Filled 2017-03-21: qty 100

## 2017-03-21 NOTE — Discharge Instructions (Signed)
Eat foods that are rich in potassium such as potatoes, bananas and cooked black beans  See the GYNecologist of your choice or the doctor listed above - Dr. Elonda Husky - who is on call today - in follow up for further counseling regarding your heavy uterine bleeding.  ER for severe or worsening symptoms.
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
## 2017-03-21 NOTE — ED Triage Notes (Addendum)
Patient c/o mid-sternal chest pain that radiates into left side of neck and arm. Patient states started this morning. Patient reports shortness of breath, nausea, and dizziness. Patient also states numbness in feet bilaterally. Patient was seen here 2 months ago for similar episode in which she was told she has a heart murmur, hypokalemia, and anemia. Patient states that she is on her period with heavy bleeding at this time. Patient states that she has to change "pad every 30 minutes"

## 2018-06-16 ENCOUNTER — Emergency Department (HOSPITAL_COMMUNITY): Payer: Medicaid Other

## 2018-06-16 ENCOUNTER — Other Ambulatory Visit: Payer: Self-pay

## 2018-06-16 ENCOUNTER — Inpatient Hospital Stay (HOSPITAL_COMMUNITY)
Admission: EM | Admit: 2018-06-16 | Discharge: 2018-06-18 | DRG: 812 | Disposition: A | Discharge status: Home / Self Care | Payer: Medicaid Other | Attending: Family Medicine | Admitting: Family Medicine

## 2018-06-16 ENCOUNTER — Encounter (HOSPITAL_COMMUNITY): Payer: Self-pay | Admitting: Emergency Medicine

## 2018-06-16 DIAGNOSIS — K529 Noninfective gastroenteritis and colitis, unspecified: Secondary | ICD-10-CM | POA: Diagnosis present

## 2018-06-16 DIAGNOSIS — D649 Anemia, unspecified: Secondary | ICD-10-CM

## 2018-06-16 DIAGNOSIS — F1290 Cannabis use, unspecified, uncomplicated: Secondary | ICD-10-CM | POA: Diagnosis present

## 2018-06-16 DIAGNOSIS — R0600 Dyspnea, unspecified: Secondary | ICD-10-CM | POA: Diagnosis present

## 2018-06-16 DIAGNOSIS — K649 Unspecified hemorrhoids: Secondary | ICD-10-CM | POA: Diagnosis present

## 2018-06-16 DIAGNOSIS — F329 Major depressive disorder, single episode, unspecified: Secondary | ICD-10-CM | POA: Diagnosis present

## 2018-06-16 DIAGNOSIS — K644 Residual hemorrhoidal skin tags: Secondary | ICD-10-CM | POA: Diagnosis present

## 2018-06-16 DIAGNOSIS — F1021 Alcohol dependence, in remission: Secondary | ICD-10-CM | POA: Diagnosis present

## 2018-06-16 DIAGNOSIS — R079 Chest pain, unspecified: Secondary | ICD-10-CM | POA: Diagnosis present

## 2018-06-16 DIAGNOSIS — Z8249 Family history of ischemic heart disease and other diseases of the circulatory system: Secondary | ICD-10-CM

## 2018-06-16 DIAGNOSIS — E860 Dehydration: Secondary | ICD-10-CM

## 2018-06-16 DIAGNOSIS — Z62810 Personal history of physical and sexual abuse in childhood: Secondary | ICD-10-CM | POA: Diagnosis present

## 2018-06-16 DIAGNOSIS — R011 Cardiac murmur, unspecified: Secondary | ICD-10-CM | POA: Diagnosis present

## 2018-06-16 DIAGNOSIS — Z7982 Long term (current) use of aspirin: Secondary | ICD-10-CM

## 2018-06-16 DIAGNOSIS — F419 Anxiety disorder, unspecified: Secondary | ICD-10-CM | POA: Diagnosis present

## 2018-06-16 DIAGNOSIS — N938 Other specified abnormal uterine and vaginal bleeding: Secondary | ICD-10-CM

## 2018-06-16 DIAGNOSIS — E872 Acidosis, unspecified: Secondary | ICD-10-CM | POA: Diagnosis present

## 2018-06-16 DIAGNOSIS — R112 Nausea with vomiting, unspecified: Secondary | ICD-10-CM

## 2018-06-16 DIAGNOSIS — Z7141 Alcohol abuse counseling and surveillance of alcoholic: Secondary | ICD-10-CM

## 2018-06-16 DIAGNOSIS — R197 Diarrhea, unspecified: Secondary | ICD-10-CM | POA: Diagnosis present

## 2018-06-16 DIAGNOSIS — Z882 Allergy status to sulfonamides status: Secondary | ICD-10-CM

## 2018-06-16 DIAGNOSIS — E876 Hypokalemia: Secondary | ICD-10-CM | POA: Diagnosis present

## 2018-06-16 DIAGNOSIS — R351 Nocturia: Secondary | ICD-10-CM | POA: Diagnosis present

## 2018-06-16 DIAGNOSIS — D539 Nutritional anemia, unspecified: Secondary | ICD-10-CM | POA: Diagnosis present

## 2018-06-16 DIAGNOSIS — D691 Qualitative platelet defects: Secondary | ICD-10-CM | POA: Diagnosis present

## 2018-06-16 DIAGNOSIS — Z881 Allergy status to other antibiotic agents status: Secondary | ICD-10-CM

## 2018-06-16 DIAGNOSIS — Z7289 Other problems related to lifestyle: Secondary | ICD-10-CM

## 2018-06-16 DIAGNOSIS — R740 Nonspecific elevation of levels of transaminase and lactic acid dehydrogenase [LDH]: Secondary | ICD-10-CM | POA: Diagnosis present

## 2018-06-16 DIAGNOSIS — D500 Iron deficiency anemia secondary to blood loss (chronic): Principal | ICD-10-CM | POA: Diagnosis present

## 2018-06-16 DIAGNOSIS — D7589 Other specified diseases of blood and blood-forming organs: Secondary | ICD-10-CM | POA: Diagnosis present

## 2018-06-16 DIAGNOSIS — D251 Intramural leiomyoma of uterus: Secondary | ICD-10-CM | POA: Diagnosis present

## 2018-06-16 DIAGNOSIS — N920 Excessive and frequent menstruation with regular cycle: Secondary | ICD-10-CM | POA: Diagnosis present

## 2018-06-16 DIAGNOSIS — Z833 Family history of diabetes mellitus: Secondary | ICD-10-CM

## 2018-06-16 DIAGNOSIS — R202 Paresthesia of skin: Secondary | ICD-10-CM | POA: Diagnosis present

## 2018-06-16 DIAGNOSIS — D696 Thrombocytopenia, unspecified: Secondary | ICD-10-CM | POA: Diagnosis present

## 2018-06-16 DIAGNOSIS — Z79899 Other long term (current) drug therapy: Secondary | ICD-10-CM

## 2018-06-16 DIAGNOSIS — Z7151 Drug abuse counseling and surveillance of drug abuser: Secondary | ICD-10-CM

## 2018-06-16 DIAGNOSIS — D693 Immune thrombocytopenic purpura: Secondary | ICD-10-CM | POA: Diagnosis present

## 2018-06-16 DIAGNOSIS — R000 Tachycardia, unspecified: Secondary | ICD-10-CM | POA: Diagnosis present

## 2018-06-16 DIAGNOSIS — N939 Abnormal uterine and vaginal bleeding, unspecified: Secondary | ICD-10-CM | POA: Diagnosis present

## 2018-06-16 DIAGNOSIS — Z23 Encounter for immunization: Secondary | ICD-10-CM

## 2018-06-16 LAB — OCCULT BLOOD, POC DEVICE: Fecal Occult Bld: POSITIVE — AB

## 2018-06-16 LAB — RETICULOCYTES
IMMATURE RETIC FRACT: 26.5 % — AB (ref 2.3–15.9)
RBC.: 3.87 MIL/uL (ref 3.87–5.11)
RETIC CT PCT: 2.6 % (ref 0.4–3.1)
Retic Count, Absolute: 100.6 10*3/uL (ref 19.0–186.0)

## 2018-06-16 LAB — HEPATIC FUNCTION PANEL
ALBUMIN: 4 g/dL (ref 3.5–5.0)
ALT: 26 U/L (ref 0–44)
AST: 53 U/L — AB (ref 15–41)
Alkaline Phosphatase: 81 U/L (ref 38–126)
BILIRUBIN TOTAL: 0.9 mg/dL (ref 0.3–1.2)
Bilirubin, Direct: 0.2 mg/dL (ref 0.0–0.2)
Indirect Bilirubin: 0.7 mg/dL (ref 0.3–0.9)
Total Protein: 8.7 g/dL — ABNORMAL HIGH (ref 6.5–8.1)

## 2018-06-16 LAB — CG4 I-STAT (LACTIC ACID): Lactic Acid, Venous: 5.38 mmol/L (ref 0.5–1.9)

## 2018-06-16 LAB — BASIC METABOLIC PANEL
ANION GAP: 14 (ref 5–15)
BUN: 11 mg/dL (ref 6–20)
CALCIUM: 8.8 mg/dL — AB (ref 8.9–10.3)
CO2: 22 mmol/L (ref 22–32)
Chloride: 100 mmol/L (ref 98–111)
Creatinine, Ser: 0.67 mg/dL (ref 0.44–1.00)
GLUCOSE: 145 mg/dL — AB (ref 70–99)
Potassium: 3.2 mmol/L — ABNORMAL LOW (ref 3.5–5.1)
Sodium: 136 mmol/L (ref 135–145)

## 2018-06-16 LAB — I-STAT BETA HCG BLOOD, ED (NOT ORDERABLE): I-stat hCG, quantitative: <5 m[IU]/mL (ref <5)

## 2018-06-16 LAB — CBC
HCT: 25.2 % — ABNORMAL LOW (ref 36.0–46.0)
HEMOGLOBIN: 6.2 g/dL — AB (ref 12.0–15.0)
MCH: 16.2 pg — AB (ref 26.0–34.0)
MCHC: 24.6 g/dL — ABNORMAL LOW (ref 30.0–36.0)
MCV: 66 fL — ABNORMAL LOW (ref 80.0–100.0)
NRBC: 0 % (ref 0.0–0.2)
PLATELETS: 45 10*3/uL — AB (ref 150–400)
RBC: 3.82 MIL/uL — AB (ref 3.87–5.11)
RDW: 24.5 % — ABNORMAL HIGH (ref 11.5–15.5)
WBC: 8.3 10*3/uL (ref 4.0–10.5)

## 2018-06-16 LAB — VITAMIN B12: VITAMIN B 12: 529 pg/mL (ref 180–914)

## 2018-06-16 LAB — FOLATE: FOLATE: 6.9 ng/mL (ref >5.9)

## 2018-06-16 LAB — POCT I-STAT TROPONIN I: Troponin i, poc: 0 ng/mL (ref 0.00–0.08)

## 2018-06-16 LAB — TSH: TSH: 0.843 u[IU]/mL (ref 0.350–4.500)

## 2018-06-16 LAB — GLUCOSE, CAPILLARY: Glucose-Capillary: 83 mg/dL (ref 70–99)

## 2018-06-16 LAB — IRON AND TIBC
Iron: 15 ug/dL — ABNORMAL LOW (ref 28–170)
Saturation Ratios: 3 % — ABNORMAL LOW (ref 10.4–31.8)
TIBC: 591 ug/dL — ABNORMAL HIGH (ref 250–450)
UIBC: 576 ug/dL

## 2018-06-16 LAB — LACTIC ACID, PLASMA: Lactic Acid, Venous: 3.2 mmol/L (ref 0.5–1.9)

## 2018-06-16 LAB — PREPARE RBC (CROSSMATCH)

## 2018-06-16 LAB — MAGNESIUM: Magnesium: 1.2 mg/dL — ABNORMAL LOW (ref 1.7–2.4)

## 2018-06-16 LAB — FERRITIN: Ferritin: 7 ng/mL — ABNORMAL LOW (ref 11–307)

## 2018-06-16 MED ORDER — VITAMIN B-1 100 MG PO TABS
100.0000 mg | ORAL_TABLET | Freq: Every day | ORAL | Status: DC
  Administered 2018-06-16 – 2018-06-18 (×2): 100 mg via ORAL
  Filled 2018-06-16 (×3): qty 1

## 2018-06-16 MED ORDER — ACETAMINOPHEN 325 MG PO TABS: 650.0000 mg | ORAL_TABLET | Freq: Four times a day (QID) | ORAL | Status: DC | PRN

## 2018-06-16 MED ORDER — ACETAMINOPHEN 650 MG RE SUPP: 650.0000 mg | Freq: Four times a day (QID) | RECTAL | Status: DC | PRN

## 2018-06-16 MED ORDER — THIAMINE HCL 100 MG/ML IJ SOLN
100.0000 mg | Freq: Every day | INTRAMUSCULAR | Status: DC
  Administered 2018-06-17: 100 mg via INTRAVENOUS
  Filled 2018-06-16: qty 2

## 2018-06-16 MED ORDER — ACETAMINOPHEN 325 MG PO TABS
650.0000 mg | ORAL_TABLET | Freq: Once | ORAL | Status: AC
  Administered 2018-06-16: 650 mg via ORAL
  Filled 2018-06-16: qty 2

## 2018-06-16 MED ORDER — ADULT MULTIVITAMIN W/MINERALS CH
1.0000 | ORAL_TABLET | Freq: Every day | ORAL | Status: DC
  Administered 2018-06-16 – 2018-06-18 (×3): 1 via ORAL
  Filled 2018-06-16 (×3): qty 1

## 2018-06-16 MED ORDER — ONDANSETRON HCL 4 MG/2ML IJ SOLN
4.0000 mg | Freq: Once | INTRAMUSCULAR | Status: AC
  Administered 2018-06-16: 4 mg via INTRAVENOUS
  Filled 2018-06-16: qty 2

## 2018-06-16 MED ORDER — POTASSIUM CHLORIDE 10 MEQ/100ML IV SOLN
10.0000 meq | Freq: Once | INTRAVENOUS | Status: AC
  Administered 2018-06-16: 10 meq via INTRAVENOUS
  Filled 2018-06-16: qty 100

## 2018-06-16 MED ORDER — OCUVITE-LUTEIN PO CAPS
1.0000 | ORAL_CAPSULE | Freq: Every day | ORAL | Status: DC
  Administered 2018-06-16 – 2018-06-18 (×3): 1 via ORAL
  Filled 2018-06-16 (×3): qty 1

## 2018-06-16 MED ORDER — TRAMADOL HCL 50 MG PO TABS
50.0000 mg | ORAL_TABLET | Freq: Two times a day (BID) | ORAL | Status: DC | PRN
  Administered 2018-06-16 – 2018-06-17 (×2): 50 mg via ORAL
  Filled 2018-06-16 (×2): qty 1

## 2018-06-16 MED ORDER — LORAZEPAM 2 MG/ML IJ SOLN
1.0000 mg | Freq: Four times a day (QID) | INTRAMUSCULAR | Status: DC | PRN
  Administered 2018-06-16 – 2018-06-17 (×4): 1 mg via INTRAVENOUS
  Filled 2018-06-16 (×4): qty 1

## 2018-06-16 MED ORDER — SODIUM CHLORIDE 0.9 % IV BOLUS
1000.0000 mL | Freq: Once | INTRAVENOUS | Status: AC
  Administered 2018-06-16: 1000 mL via INTRAVENOUS

## 2018-06-16 MED ORDER — FOLIC ACID 1 MG PO TABS
1.0000 mg | ORAL_TABLET | Freq: Every day | ORAL | Status: DC
  Administered 2018-06-16 – 2018-06-18 (×3): 1 mg via ORAL
  Filled 2018-06-16 (×3): qty 1

## 2018-06-16 MED ORDER — ONDANSETRON HCL 4 MG/2ML IJ SOLN
4.0000 mg | Freq: Four times a day (QID) | INTRAMUSCULAR | Status: DC | PRN
  Administered 2018-06-16 – 2018-06-17 (×4): 4 mg via INTRAVENOUS
  Filled 2018-06-16 (×4): qty 2

## 2018-06-16 MED ORDER — MAGNESIUM SULFATE 2 GM/50ML IV SOLN
2.0000 g | Freq: Once | INTRAVENOUS | Status: AC
  Administered 2018-06-16: 2 g via INTRAVENOUS
  Filled 2018-06-16: qty 50

## 2018-06-16 MED ORDER — LORAZEPAM 1 MG PO TABS: 1.0000 mg | ORAL_TABLET | Freq: Four times a day (QID) | ORAL | Status: DC | PRN

## 2018-06-16 MED ORDER — ONDANSETRON HCL 4 MG PO TABS
4.0000 mg | ORAL_TABLET | Freq: Four times a day (QID) | ORAL | Status: DC | PRN
  Administered 2018-06-18: 4 mg via ORAL
  Filled 2018-06-16: qty 1

## 2018-06-16 MED ORDER — SODIUM CHLORIDE 0.9% IV SOLUTION: Freq: Once | INTRAVENOUS | Status: DC

## 2018-06-16 MED ORDER — VITAMIN B-1 100 MG PO TABS: 100.0000 mg | ORAL_TABLET | Freq: Every day | ORAL | Status: DC

## 2018-06-16 MED ORDER — INFLUENZA VAC SPLIT QUAD 0.5 ML IM SUSY
0.5000 mL | PREFILLED_SYRINGE | INTRAMUSCULAR | Status: AC
  Administered 2018-06-18: 0.5 mL via INTRAMUSCULAR
  Filled 2018-06-16: qty 0.5

## 2018-06-16 MED ORDER — FERROUS SULFATE 325 (65 FE) MG PO TABS: 325.0000 mg | ORAL_TABLET | Freq: Three times a day (TID) | ORAL | Status: DC

## 2018-06-16 MED ORDER — FOLIC ACID 1 MG PO TABS: 1.0000 mg | ORAL_TABLET | Freq: Every day | ORAL | Status: DC

## 2018-06-16 NOTE — ED Notes (Addendum)
CRITICAL VALUE ALERT  Critical Value:  Lactic Acid  Date & Time Notied:  06/16/18 & 1905 hrs  Provider Notified: Hospitalist Gonfa  Orders Received/Actions taken: N/A
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
## 2018-06-16 NOTE — ED Triage Notes (Signed)
Patient reports chest pain, emesis, and diarrhea that started today. Patient states she has had SOB and numbness in her feet x 2 weeks.
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
## 2018-06-16 NOTE — ED Notes (Addendum)
CRITICAL VALUE ALERT  Critical Value:  Lactic Acid  Date & Time Notied:  06/16/2018  Provider Notified [place]  Orders Received/Actions taken: Lactic Acid test done by mistake instead of troponin

## 2018-06-17 ENCOUNTER — Inpatient Hospital Stay (HOSPITAL_COMMUNITY): Payer: Medicaid Other

## 2018-06-17 ENCOUNTER — Encounter (HOSPITAL_COMMUNITY): Payer: Self-pay | Admitting: Family Medicine

## 2018-06-17 ENCOUNTER — Observation Stay (HOSPITAL_COMMUNITY): Payer: Medicaid Other

## 2018-06-17 DIAGNOSIS — K529 Noninfective gastroenteritis and colitis, unspecified: Secondary | ICD-10-CM | POA: Diagnosis present

## 2018-06-17 DIAGNOSIS — Z7289 Other problems related to lifestyle: Secondary | ICD-10-CM | POA: Diagnosis not present

## 2018-06-17 DIAGNOSIS — N920 Excessive and frequent menstruation with regular cycle: Secondary | ICD-10-CM | POA: Diagnosis present

## 2018-06-17 DIAGNOSIS — E872 Acidosis: Secondary | ICD-10-CM | POA: Diagnosis present

## 2018-06-17 DIAGNOSIS — Z833 Family history of diabetes mellitus: Secondary | ICD-10-CM | POA: Diagnosis not present

## 2018-06-17 DIAGNOSIS — N938 Other specified abnormal uterine and vaginal bleeding: Secondary | ICD-10-CM | POA: Diagnosis present

## 2018-06-17 DIAGNOSIS — I340 Nonrheumatic mitral (valve) insufficiency: Secondary | ICD-10-CM

## 2018-06-17 DIAGNOSIS — D251 Intramural leiomyoma of uterus: Secondary | ICD-10-CM | POA: Diagnosis present

## 2018-06-17 DIAGNOSIS — D539 Nutritional anemia, unspecified: Secondary | ICD-10-CM | POA: Diagnosis present

## 2018-06-17 DIAGNOSIS — F329 Major depressive disorder, single episode, unspecified: Secondary | ICD-10-CM | POA: Diagnosis present

## 2018-06-17 DIAGNOSIS — F1290 Cannabis use, unspecified, uncomplicated: Secondary | ICD-10-CM | POA: Diagnosis present

## 2018-06-17 DIAGNOSIS — D649 Anemia, unspecified: Secondary | ICD-10-CM

## 2018-06-17 DIAGNOSIS — N939 Abnormal uterine and vaginal bleeding, unspecified: Secondary | ICD-10-CM | POA: Diagnosis not present

## 2018-06-17 DIAGNOSIS — F1021 Alcohol dependence, in remission: Secondary | ICD-10-CM | POA: Diagnosis present

## 2018-06-17 DIAGNOSIS — R000 Tachycardia, unspecified: Secondary | ICD-10-CM | POA: Diagnosis present

## 2018-06-17 DIAGNOSIS — D693 Immune thrombocytopenic purpura: Secondary | ICD-10-CM | POA: Diagnosis present

## 2018-06-17 DIAGNOSIS — E860 Dehydration: Secondary | ICD-10-CM | POA: Diagnosis present

## 2018-06-17 DIAGNOSIS — K644 Residual hemorrhoidal skin tags: Secondary | ICD-10-CM | POA: Diagnosis present

## 2018-06-17 DIAGNOSIS — D500 Iron deficiency anemia secondary to blood loss (chronic): Principal | ICD-10-CM

## 2018-06-17 DIAGNOSIS — R351 Nocturia: Secondary | ICD-10-CM | POA: Diagnosis present

## 2018-06-17 DIAGNOSIS — R202 Paresthesia of skin: Secondary | ICD-10-CM | POA: Diagnosis present

## 2018-06-17 DIAGNOSIS — D691 Qualitative platelet defects: Secondary | ICD-10-CM

## 2018-06-17 DIAGNOSIS — E876 Hypokalemia: Secondary | ICD-10-CM | POA: Diagnosis present

## 2018-06-17 DIAGNOSIS — Z23 Encounter for immunization: Secondary | ICD-10-CM | POA: Diagnosis not present

## 2018-06-17 DIAGNOSIS — R0600 Dyspnea, unspecified: Secondary | ICD-10-CM

## 2018-06-17 DIAGNOSIS — R011 Cardiac murmur, unspecified: Secondary | ICD-10-CM | POA: Diagnosis present

## 2018-06-17 DIAGNOSIS — Z832 Family history of diseases of the blood and blood-forming organs and certain disorders involving the immune mechanism: Secondary | ICD-10-CM

## 2018-06-17 DIAGNOSIS — R740 Nonspecific elevation of levels of transaminase and lactic acid dehydrogenase [LDH]: Secondary | ICD-10-CM | POA: Diagnosis present

## 2018-06-17 DIAGNOSIS — D7589 Other specified diseases of blood and blood-forming organs: Secondary | ICD-10-CM | POA: Diagnosis not present

## 2018-06-17 DIAGNOSIS — D696 Thrombocytopenia, unspecified: Secondary | ICD-10-CM

## 2018-06-17 DIAGNOSIS — F419 Anxiety disorder, unspecified: Secondary | ICD-10-CM | POA: Diagnosis present

## 2018-06-17 DIAGNOSIS — K649 Unspecified hemorrhoids: Secondary | ICD-10-CM

## 2018-06-17 DIAGNOSIS — R197 Diarrhea, unspecified: Secondary | ICD-10-CM

## 2018-06-17 LAB — URINALYSIS, ROUTINE W REFLEX MICROSCOPIC
Bacteria, UA: NONE SEEN
Bilirubin Urine: NEGATIVE
Glucose, UA: NEGATIVE mg/dL
KETONES UR: NEGATIVE mg/dL
Leukocytes, UA: NEGATIVE
Nitrite: NEGATIVE
Protein, ur: NEGATIVE mg/dL
Specific Gravity, Urine: 1.026 (ref 1.005–1.030)
pH: 6 (ref 5.0–8.0)

## 2018-06-17 LAB — CBC
HEMATOCRIT: 26.7 % — AB (ref 36.0–46.0)
Hemoglobin: 7.3 g/dL — ABNORMAL LOW (ref 12.0–15.0)
MCH: 19.7 pg — ABNORMAL LOW (ref 26.0–34.0)
MCHC: 27.3 g/dL — ABNORMAL LOW (ref 30.0–36.0)
MCV: 72.2 fL — ABNORMAL LOW (ref 80.0–100.0)
NRBC: 0.3 % — AB (ref 0.0–0.2)
PLATELETS: 42 10*3/uL — AB (ref 150–400)
RBC: 3.7 MIL/uL — AB (ref 3.87–5.11)
RDW: 28.2 % — AB (ref 11.5–15.5)
WBC: 7.9 10*3/uL (ref 4.0–10.5)

## 2018-06-17 LAB — COMPREHENSIVE METABOLIC PANEL
ALBUMIN: 3.4 g/dL — AB (ref 3.5–5.0)
ALK PHOS: 65 U/L (ref 38–126)
ALT: 19 U/L (ref 0–44)
ANION GAP: 9 (ref 5–15)
AST: 32 U/L (ref 15–41)
BUN: 11 mg/dL (ref 6–20)
CALCIUM: 8.3 mg/dL — AB (ref 8.9–10.3)
CO2: 22 mmol/L (ref 22–32)
Chloride: 103 mmol/L (ref 98–111)
Creatinine, Ser: 0.57 mg/dL (ref 0.44–1.00)
GFR calc non Af Amer: >60 mL/min (ref >60)
GLUCOSE: 69 mg/dL — AB (ref 70–99)
POTASSIUM: 3.4 mmol/L — AB (ref 3.5–5.1)
Sodium: 134 mmol/L — ABNORMAL LOW (ref 135–145)
Total Bilirubin: 1.2 mg/dL (ref 0.3–1.2)
Total Protein: 7.1 g/dL (ref 6.5–8.1)

## 2018-06-17 LAB — ECHOCARDIOGRAM COMPLETE
HEIGHTINCHES: 67 in
Weight: 2656 oz

## 2018-06-17 LAB — HEMOGLOBIN A1C
HEMOGLOBIN A1C: 4.4 % — AB (ref 4.8–5.6)
MEAN PLASMA GLUCOSE: 79.58 mg/dL

## 2018-06-17 LAB — MAGNESIUM: Magnesium: 1.7 mg/dL (ref 1.7–2.4)

## 2018-06-17 LAB — HEMOGLOBIN AND HEMATOCRIT, BLOOD
HCT: 34.9 % — ABNORMAL LOW (ref 36.0–46.0)
Hemoglobin: 10.1 g/dL — ABNORMAL LOW (ref 12.0–15.0)

## 2018-06-17 LAB — GLUCOSE, CAPILLARY
Glucose-Capillary: 65 mg/dL — ABNORMAL LOW (ref 70–99)
Glucose-Capillary: 130 mg/dL — ABNORMAL HIGH (ref 70–99)
Glucose-Capillary: 114 mg/dL — ABNORMAL HIGH (ref 70–99)

## 2018-06-17 LAB — RAPID URINE DRUG SCREEN, HOSP PERFORMED
AMPHETAMINES: NOT DETECTED
BARBITURATES: NOT DETECTED
BENZODIAZEPINES: POSITIVE — AB
COCAINE: NOT DETECTED
Opiates: NOT DETECTED
Tetrahydrocannabinol: NOT DETECTED

## 2018-06-17 LAB — T4, FREE: FREE T4: 0.64 ng/dL — AB (ref 0.82–1.77)

## 2018-06-17 LAB — LACTATE DEHYDROGENASE: LDH: 135 U/L (ref 98–192)

## 2018-06-17 LAB — PREPARE RBC (CROSSMATCH)

## 2018-06-17 LAB — LACTIC ACID, PLASMA: LACTIC ACID, VENOUS: 1.6 mmol/L (ref 0.5–1.9)

## 2018-06-17 MED ORDER — SODIUM CHLORIDE 0.9% IV SOLUTION: Freq: Once | INTRAVENOUS | Status: DC

## 2018-06-17 MED ORDER — POTASSIUM CHLORIDE CRYS ER 20 MEQ PO TBCR
40.0000 meq | EXTENDED_RELEASE_TABLET | Freq: Once | ORAL | Status: AC
  Administered 2018-06-17: 40 meq via ORAL
  Filled 2018-06-17: qty 2

## 2018-06-17 MED ORDER — FERROUS SULFATE 325 (65 FE) MG PO TABS
325.0000 mg | ORAL_TABLET | Freq: Two times a day (BID) | ORAL | Status: DC
  Administered 2018-06-17 – 2018-06-18 (×2): 325 mg via ORAL
  Filled 2018-06-17 (×2): qty 1

## 2018-06-17 MED ORDER — DEXTROSE 50 % IV SOLN
INTRAVENOUS | Status: AC
  Administered 2018-06-17: 50 mL
  Filled 2018-06-17: qty 50

## 2018-06-17 MED ORDER — DEXAMETHASONE 4 MG PO TABS: 40.0000 mg | ORAL_TABLET | Freq: Every day | ORAL | Status: DC

## 2018-06-17 MED ORDER — DEXAMETHASONE 4 MG PO TABS
40.0000 mg | ORAL_TABLET | Freq: Every day | ORAL | Status: DC
  Administered 2018-06-17 – 2018-06-18 (×2): 40 mg via ORAL
  Filled 2018-06-17 (×2): qty 10

## 2018-06-17 MED ORDER — MEGESTROL ACETATE 40 MG PO TABS
40.0000 mg | ORAL_TABLET | Freq: Three times a day (TID) | ORAL | Status: DC
  Administered 2018-06-17 – 2018-06-18 (×3): 40 mg via ORAL
  Filled 2018-06-17 (×10): qty 1

## 2018-06-17 MED ORDER — PANTOPRAZOLE SODIUM 40 MG PO TBEC
40.0000 mg | DELAYED_RELEASE_TABLET | Freq: Every day | ORAL | Status: DC
  Administered 2018-06-17 – 2018-06-18 (×2): 40 mg via ORAL
  Filled 2018-06-17 (×2): qty 1
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
## 2018-06-17 NOTE — Progress Notes (Signed)
PROGRESS NOTE    Brittney Melendez  ZOX:[id number]  DOB: 02-Aug-1976  DOA: 06/16/2018 PCP: Patient, No Pcp Per   Brief Admission Hx: Brittney Melendez is a 42 y.o. female with medical history significant for AUB, thrombocytopenia, iron deficiency anemia, anxiety and hypokalemia who presented with multiple symptoms including gradually worsening exertional dyspnea for 2 weeks, palpitation, lightheadedness, chest pain and diarrhea.  MDM/Assessment & Plan:   1. Symptomatic anemia / chronic blood loss anemia - I suspect this is multifactorial given that she has been having abnormal uterine bleeding, thrombocytopenia and platelet dysfunction and Hemoccult positive stools.  I have asked for GYN consultation and for a GI consult and also a hematology consult.  She is status post 2 units of packed red blood cells and she remains symptomatic and her hemoglobin has only come up to 7.  I have ordered for an additional 2 units of packed red blood cells.  Follow H&H.  She may need an iron transfusion but would defer to the hematology team.  The patient is also on oral iron tablets.  Gynecology has seen her and started her on Medrol to help slow down the vaginal bleeding. 2. Thrombocytopenia -patient has a history of some type of unknown platelet dysfunction and she had been referred to hematology but never followed up.  I have asked for hematology to see her in the hospital. 3. Chronic diarrhea-patient admits to marijuana use.  She has been for 2 GI for outpatient follow-up but never did follow-up.  I have asked for GI to see her in the hospital.  If she has recurrent diarrhea would check a C. difficile and GI panel.  Follow LFTs. 4. Lactic acidosis-suspect secondary to mild dehydration and responded well to IV fluid challenge.  No signs of infection have been found at this time. 5. Hemoccult positive stool- possibly from hemorrhoids however I have requested a GI consultation given her severe  anemia. 6. Hypokalemia and hypomagnesemia-replacing IV and will follow levels. 7. Dysfunctional uterine bleeding- patient has fibroids and gynecology has seen her and ordered a repeat vaginal ultrasound and started her on Medrol to slow down the bleeding. 8. Substance abuse-patient has admitted to marijuana use and was counseled against recreational drug use.  UDS has been ordered and pending. 9. Chronic alcoholism-patient appears chronically ill and much older than stated age, counseled on alcohol consumption and CIWA protocol has been started.  Continue multivitamin and thiamine supplements.  DVT prophylaxis: SCDs Code Status: Full code Family Communication: Patient fully updated at bedside Disposition Plan: Inpatient for subspecialty care and work-up and blood transfusions  Consultants:  GI  GYN  Hematology  Procedures:  PRBC transfusion  Antimicrobials:  N/A  Subjective: The patient reports that she still feels very bad.  She feels tired and weak and she does not have much appetite.  She denies that she is ever had severe alcohol withdrawal or tremors.  She does have some shortness of breath but no chest pain.  Objective: Vitals:   06/16/18 2353 06/17/18 0008 06/17/18 0328 06/17/18 0654  BP: (!) 135/93 (!) 143/90 (!) 149/100 (!) 149/97  Pulse: 94 91 90 88  Resp: 18 18 18    Temp: 98.4 F (36.9 C) 98.5 F (36.9 C) 98.5 F (36.9 C) 99.3 F (37.4 C)  TempSrc: Oral Oral Oral Oral  SpO2: 99% 99% 99% 99%  Weight:      Height:        Intake/Output Summary (Last 24 hours) at 06/17/2018 1226 Last  data filed at 06/17/2018 0328 Gross per 24 hour  Intake 3520 ml  Output 300 ml  Net 3220 ml   Filed Weights   06/16/18 1438 06/16/18 2021  Weight: 73.9 kg 75.3 kg   REVIEW OF SYSTEMS  As per history otherwise all reviewed and reported negative  Exam:  General exam: Awake, alert, chronically ill-appearing but no apparent distress. Respiratory system: Clear. No  increased work of breathing. Cardiovascular system: S1 & S2 heard, RRR. No JVD, murmurs, gallops, clicks or pedal edema. Gastrointestinal system: Abdomen is nondistended, soft and nontender. Normal bowel sounds heard. Central nervous system: Alert and oriented. No focal neurological deficits. Extremities: no CCE.  Data Reviewed: Basic Metabolic Panel: Recent Labs  Lab 06/16/18 1500 06/16/18 1559 06/17/18 0505  NA 136  --  134*  K 3.2*  --  3.4*  CL 100  --  103  CO2 22  --  22  GLUCOSE 145*  --  69*  BUN 11  --  11  CREATININE 0.67  --  0.57  CALCIUM 8.8*  --  8.3*  MG  --  1.2* 1.7   Liver Function Tests: Recent Labs  Lab 06/16/18 1748 06/17/18 0505  AST 53* 32  ALT 26 19  ALKPHOS 81 65  BILITOT 0.9 1.2  PROT 8.7* 7.1  ALBUMIN 4.0 3.4*   No results for input(s): LIPASE, AMYLASE in the last 168 hours. No results for input(s): AMMONIA in the last 168 hours. CBC: Recent Labs  Lab 06/16/18 1500 06/17/18 0505  WBC 8.3 7.9  HGB 6.2* 7.3*  HCT 25.2* 26.7*  MCV 66.0* 72.2*  PLT 45* 42*   Cardiac Enzymes: No results for input(s): CKTOTAL, CKMB, CKMBINDEX, TROPONINI in the last 168 hours. CBG (last 3)  Recent Labs    06/16/18 2334 06/17/18 0724 06/17/18 1112  GLUCAP 83 65* 130*   No results found for this or any previous visit (from the past 240 hour(s)).   Studies: Dg Chest 2 View  Result Date: 06/16/2018 CLINICAL DATA:  42 year old female with chest pain, lightheadedness, nausea vomiting since last night. EXAM: CHEST - 2 VIEW COMPARISON:  Chest radiographs 03/21/2017. FINDINGS: Seated AP and lateral views. Low normal lung volumes. Mediastinal contours are within normal limits. Visualized tracheal air column is within normal limits. Both lungs appear clear. No pneumothorax or pleural effusion. No osseous abnormality identified. Paucity of visible bowel gas. IMPRESSION: Negative. No cardiopulmonary abnormality. Electronically Signed   By: Genevie Ann M.D.   On:  06/16/2018 15:10   Scheduled Meds: . sodium chloride   Intravenous Once  . ferrous sulfate  325 mg Oral BID WC  . folic acid  1 mg Oral Daily  . Influenza vac split quadrivalent PF  0.5 mL Intramuscular Tomorrow-1000  . megestrol  40 mg Oral TID  . multivitamin with minerals  1 tablet Oral Daily  . multivitamin-lutein  1 capsule Oral Daily  . potassium chloride  40 mEq Oral Once  . thiamine  100 mg Oral Daily   Or  . thiamine  100 mg Intravenous Daily   Continuous Infusions:  Principal Problem:   Symptomatic anemia Active Problems:   Hypokalemia   Diarrhea   Abnormal uterine bleeding   Bicytopenia   Thrombocytopenia (HCC)   Hypomagnesemia   Nocturia   Abnormal platelet function (HCC)   Lactic acidosis   Chest pain   Dyspnea   Time spent:   Irwin Brakeman, MD, FAAFP [facility] Pager [phone]  If 7PM-7AM,  please contact night-coverage [email or web address] Password [email or web address] 06/17/2018, 12:26 PM    LOS: 0 days
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
## 2018-06-17 NOTE — Progress Notes (Signed)
*  PRELIMINARY RESULTS* Echocardiogram 2D Echocardiogram has been performed.  Brittney Melendez 06/17/2018, 10:23 AM

## 2018-06-17 NOTE — Progress Notes (Signed)
Gyn note:  Consult received this morning and patient interviewed,, and chart briefly reviewed, full note to follow. Briefly, 42 year old female with chronic menorrhagia and abnormal uterine bleeding of several years duration.  No regular GYN care.  She had a transvaginal pelvic ultrasound 1 1/2 years ago that showed a minimally enlarged uterus with small fibroids.  Patient does not recall the time of her last Pap smear, or recent GYN exam. Patient was symptomatically anemic upon arrival hemoglobin 6.29 improved slightly after 2 units packed cells at 7.6 patient still appears fatigued and plans according to patient are for additional transfusions this a.m. which I agree with.  I started her on Megace 40 mg 3 times daily to try to suppress the endometrial bleeding hormonally, ordered a repeat transvaginal ultrasound to update status of the uterus and will do a pelvic exam later this p.m.  Thank you for the consult full note to follow

## 2018-06-17 NOTE — Progress Notes (Signed)
Held morning meds at patient request because she stated that she was nauseous. Will administer in afternoon.

## 2018-06-17 NOTE — Care Management Note (Signed)
Case Management Note  Patient Details  Name: Brittney Melendez MRN: [id number] Date of Birth: 1976-06-16  Subjective/Objective:     Pt admitted with anemia. She is from home with family. She is ind with ADL's. She has [employer] medicaid but no PCP. Pt lives in [place]. She was given list of PCPs and OBGYNs last admission and has not followed up. [facility] , they now has OBGYN services also as well as primary care.  Added to AVS for patient to schedule appt.    Action/Plan: DC home.    Expected Discharge Date:     06/18/2018             Expected Discharge Plan:  Home/Self Care  In-House Referral:     Discharge planning Services  CM Consult  Post Acute Care Choice:  NA Choice offered to:  NA  DME Arranged:    DME Agency:     HH Arranged:    HH Agency:     Status of Service:  Completed, signed off  If discussed at [employer] of Stay Meetings, dates discussed:    Additional Comments:  Yareth Macdonnell, Chauncey Reading, RN 06/17/2018, 8:14 AM

## 2018-06-17 NOTE — Progress Notes (Signed)
Patient's blood sugar was 65 at 0724. Gave Sprite and patient is eating breakfast. Will recheck

## 2018-06-18 ENCOUNTER — Telehealth: Payer: Self-pay | Admitting: Gastroenterology

## 2018-06-18 DIAGNOSIS — E876 Hypokalemia: Secondary | ICD-10-CM

## 2018-06-18 DIAGNOSIS — D693 Immune thrombocytopenic purpura: Secondary | ICD-10-CM | POA: Diagnosis present

## 2018-06-18 LAB — HEPATITIS PANEL, ACUTE
HCV Ab: 0.2 s/co ratio (ref 0.0–0.9)
HEP A IGM: NEGATIVE
Hep B C IgM: NEGATIVE
Hepatitis B Surface Ag: NEGATIVE

## 2018-06-18 LAB — BASIC METABOLIC PANEL
Anion gap: 7 (ref 5–15)
BUN: 12 mg/dL (ref 6–20)
CO2: 23 mmol/L (ref 22–32)
CREATININE: 0.49 mg/dL (ref 0.44–1.00)
Calcium: 9.2 mg/dL (ref 8.9–10.3)
Chloride: 105 mmol/L (ref 98–111)
GFR calc Af Amer: >60 mL/min (ref >60)
Glucose, Bld: 156 mg/dL — ABNORMAL HIGH (ref 70–99)
Potassium: 4.2 mmol/L (ref 3.5–5.1)
SODIUM: 135 mmol/L (ref 135–145)

## 2018-06-18 LAB — GLUCOSE, CAPILLARY: GLUCOSE-CAPILLARY: 135 mg/dL — AB (ref 70–99)

## 2018-06-18 LAB — CBC
HCT: 34.4 % — ABNORMAL LOW (ref 36.0–46.0)
Hemoglobin: 9.9 g/dL — ABNORMAL LOW (ref 12.0–15.0)
MCH: 21.7 pg — AB (ref 26.0–34.0)
MCHC: 28.8 g/dL — AB (ref 30.0–36.0)
MCV: 75.3 fL — ABNORMAL LOW (ref 80.0–100.0)
PLATELETS: 75 10*3/uL — AB (ref 150–400)
RBC: 4.57 MIL/uL (ref 3.87–5.11)
WBC: 5.6 10*3/uL (ref 4.0–10.5)
nRBC: 0.5 % — ABNORMAL HIGH (ref 0.0–0.2)

## 2018-06-18 LAB — HIV ANTIBODY (ROUTINE TESTING W REFLEX): HIV Screen 4th Generation wRfx: NONREACTIVE

## 2018-06-18 MED ORDER — MEGESTROL ACETATE 40 MG PO TABS: 40.0000 mg | ORAL_TABLET | Freq: Three times a day (TID) | ORAL | 0 refills | Status: AC

## 2018-06-18 MED ORDER — FERROUS SULFATE 325 (65 FE) MG PO TABS: 325.0000 mg | ORAL_TABLET | Freq: Every day | ORAL | Status: DC

## 2018-06-18 MED ORDER — LORAZEPAM 0.5 MG PO TABS: 0.5000 mg | ORAL_TABLET | Freq: Three times a day (TID) | ORAL | 0 refills | Status: DC | PRN

## 2018-06-18 MED ORDER — THIAMINE HCL 100 MG PO TABS: 100.0000 mg | ORAL_TABLET | Freq: Every day | ORAL | Status: DC

## 2018-06-18 MED ORDER — DEXAMETHASONE 4 MG PO TABS: 40.0000 mg | ORAL_TABLET | Freq: Every day | ORAL | 0 refills | Status: AC

## 2018-06-18 MED ORDER — FOLIC ACID 1 MG PO TABS: 1.0000 mg | ORAL_TABLET | Freq: Every day | ORAL | Status: DC

## 2018-06-18 NOTE — Discharge Summary (Signed)
Physician Discharge Summary  Brittney Melendez:[id number] DOB: 1976/07/02 DOA: 06/16/2018  PCP: [facility] Gynecologist: Dr. Glo Herring Hematologist: Dr. Delton Coombes  Admit date: 06/16/2018 Discharge date: 06/18/2018  Admitted From: Home  Disposition: Home   Recommendations for Outpatient Follow-up:  1. Follow up with PCP in 2 weeks 2. Please follow up with hematologist in 1 week 3. Follow up with gynecologist in 2 weeks  Discharge Condition: STABLE   CODE STATUS: FULL    Brief Hospitalization Summary: Please see all hospital notes, images, labs for full details of the hospitalization. HPI Dr. Cyndia Melendez: [name] is a 42 y.o. female with medical history significant for AUB, thrombocytopenia, iron deficiency anemia, anxiety and hypokalemia who presented with multiple symptoms including gradually worsening exertional dyspnea for 2 weeks, palpitation, lightheadedness, chest pain and diarrhea.  She says she was not able to walk to the door this morning and had to sit down.  She also had chest pain that has resolved now.  He describes the pain as sharp.  She felt some throbbing and aching in his left arm.  Chest pain is not necessarily exertional.  She also reports cramping and pain sensation in her feet and legs to mid shin. She has chills but denies fever.  Diarrhea is brownish watery with blood tinge. She denies nausea or vomiting.  She denies dysuria but reports nocturia.  She denies smoking cigarettes.  She reports drinking about 4 glasses of liquor once a week.  She says her last drink was yesterday when she had 1 and half a glass of vodka.  She admits marijuana use but denies other recreational drugs.  Patient had similar presentation in May 2018 when she was admitted with symptomatic anemia and thrombocytopenia.  At that time, HIT test was significant for abnormal platelet function but normal ADAMTS13 activity.  Abdominal ultrasound was benign.  She was transfused pRBC  and discharged home on oral iron to follow-up with PCP and gynecology.  Patient was inconsistent with oral iron.  She also did not follow-up with PCP OB/GYN. Of note, patient had transvaginal ultrasound about 2 years ago which showed small uterine fibroid.  In ED, vital signs significant for mild tachycardia.  This hemoglobin 6.2, platelet 45 and reticulocyte 2.6. (adjusts to 1.6 with reticulocyte index of 1).  Potassium 3.2.  Magnesium 1.2.  Initial troponin 0.  Lactic acid 5.38.  Fecal occult blood positive.  Troponin negative.  EKG with tachycardia and some EKG changes but not consistent with ACS.  Typed and crossed.  2 units of blood, 2 L of normal saline bolus, magnesium and potassium ordered.  Hospitalist service was called to admit patient.  I have requested EDP to obtain anemia panel before transfusion.   Brief Admission Hx: Brittney Melendez a 42 y.o.femalewith medical history significant forAUB, thrombocytopenia,iron deficiency anemia, anxiety and hypokalemia who presented with multiple symptoms including gradually worsening exertional dyspnea for 2 weeks,palpitation, lightheadedness,chest painanddiarrhea.  MDM/Assessment & Plan:   1. Symptomatic anemia / chronic blood loss anemia - I suspect this is multifactorial given that she has been having abnormal uterine bleeding, thrombocytopenia and platelet dysfunction and Hemoccult positive stools.  I requested GYN consultation and for a GI consult and also a hematology consult.  She is status post 2 units of packed red blood cells and she remains symptomatic and her hemoglobin has only come up to 7.  I ordered for an additional 2 units of packed red blood cells and Hg up to 10.  The patient is also on oral iron tablets.  Gynecology has seen her and started her on Medrol to help slow down the vaginal bleeding.  Pt had vaginal ultrasound showing uterine fibroids.   2. Thrombocytopenia -hematologist concerned about ITP.  Pt was started  on dexamethasone 40 mg daily x 4 doses.  Pt given RX to complete the remaining 3 doses at discharge.  Platelets have started improving after the first dose of dexamethasone given 11/8.  Platelets up to 75 today.   3. Chronic diarrhea-patient admits to marijuana use.  She has been for 2 GI for outpatient follow-up but never did follow-up.  I have asked for GI to see her in the hospital.  She did not have diarrhea in hospital and therefore C diff and GI panel was not ordered.   4. Lactic acidosis-suspect secondary to mild dehydration and responded well to IV fluid challenge.  No signs of infection have been found at this time. 5. Hemoccult positive stool- Pt was seen by GI and they have recommended patient have outpatient TCS. 6. Hypokalemia and hypomagnesemia-repleted.  7. Dysfunctional uterine bleeding- patient has fibroids and gynecology has seen her and ordered a repeat vaginal ultrasound and started her on Medrol to slow down the bleeding. Pt will need to follow up with GYN to have PAP smear and consideration of endometrial ablation or other treatments to help with heavy bleeding.  Pt says that she will be sure to follow up.  She was also given information for [facility] in [place] and they have gynecology services available there.  8. Substance abuse-patient has admitted to marijuana use and was counseled against recreational drug use. 9. Chronic alcoholism-patient strongly advised to stop using alcohol.   Continue multivitamin and thiamine supplements.  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   ferrous sulfate 325 (65 FE) MG tablet Take 1 tablet (325 mg total) by mouth daily.   folic acid 1 MG tablet Commonly known as:  FOLVITE Take 1 tablet (1 mg total) by mouth daily.   LORazepam 0.5 MG tablet Commonly known as:  ATIVAN Take 1 tablet (0.5 mg total) by mouth every 8 (eight) hours as needed for anxiety (No alcohol Use.).   megestrol 40 MG tablet Commonly known as:  MEGACE Take 1 tablet (40 mg total) by mouth 3 (three) times daily for 14 days.   MULTIVITAMIN/IRON PO Take 1 tablet by mouth daily.   thiamine 100 MG tablet Take 1 tablet (100 mg total) by mouth daily.  Follow-up [facility].   Why:  please call and schedule an appt for follow up - for primary care and OB GYN  Contact information: [phone]        Derek Jack, MD. Schedule an appointment as soon as possible for a visit in 1 week(s).   Specialty:  Hematology Why:  Hospital Follow Up  Contact information: [place] [address] [phone]        [name], MD. Schedule an appointment as soon as possible for a visit in 2 week(s).   Specialties:  Obstetrics and Gynecology, Radiology Why:  Hospital Follow Up  Contact information: [place] [address] [phone]        Danie Binder, MD. Schedule an appointment as soon as possible for a visit in 1 month(s).   Specialty:   Gastroenterology Why:  Hospital Follow Up for Heme Positive Stools Contact information: [place] [address] [phone]          Allergies  Allergen Reactions  . Rocephin [Ceftriaxone] Rash  . Sulfa Antibiotics Itching   Allergies as of 06/18/2018      Reactions   Rocephin [ceftriaxone] Rash   Sulfa Antibiotics Itching      Medication List    TAKE these medications   dexamethasone 4 MG tablet Commonly known as:  DECADRON Take 10 tablets (40 mg total) by mouth daily for 3 days.   ferrous sulfate 325 (65 FE) MG tablet Take 1 tablet (325 mg total) by mouth daily.   folic acid 1 MG tablet Commonly known as:  FOLVITE Take 1 tablet (1 mg total) by mouth daily.   LORazepam 0.5 MG tablet Commonly known as:  ATIVAN Take 1 tablet (0.5 mg total) by mouth every 8 (eight) hours as needed for anxiety (No alcohol Use.).   megestrol 40 MG tablet Commonly known as:  MEGACE Take 1 tablet (40 mg total) by mouth 3 (three) times daily for 14 days.   MULTIVITAMIN/IRON PO Take 1 tablet by mouth daily.   thiamine 100 MG tablet Take 1 tablet (100 mg total) by mouth daily.       Procedures/Studies: Dg Chest 2 View  Result Date: 06/16/2018 CLINICAL DATA:  42 year old female with chest pain, lightheadedness, nausea vomiting since last night. EXAM: CHEST - 2 VIEW COMPARISON:  Chest radiographs 03/21/2017. FINDINGS: Seated AP and lateral views. Low normal lung volumes. Mediastinal contours are within normal limits. Visualized tracheal air column is within normal limits. Both lungs appear clear. No pneumothorax or pleural effusion. No osseous abnormality identified. Paucity of visible bowel gas. IMPRESSION: Negative. No cardiopulmonary abnormality. Electronically Signed   By: Genevie Ann M.D.   On: 06/16/2018 15:10   US Pelvic Complete With Transvaginal  Result Date: 06/17/2018 CLINICAL DATA:  Menorrhagia.  History of fibroids. EXAM: TRANSABDOMINAL AND TRANSVAGINAL  ULTRASOUND OF PELVIS TECHNIQUE: Both transabdominal and transvaginal ultrasound examinations of the pelvis were performed. Transabdominal technique was performed for global imaging of the pelvis including uterus, ovaries, adnexal regions, and pelvic cul-de-sac. It was necessary to proceed with endovaginal exam following the transabdominal exam to visualize the endometrium and adnexa. COMPARISON:  Pelvic ultrasound August 19, 2016 FINDINGS: Uterus Measurements: 12.6 x 6.9 x 6.6 cm = volume: 296 mL. 2.7 x 1.8 x 3 cm fundal intramural heterogeneously hypoechoic leiomyoma and 3.1 x 1.9 x 3 cm RIGHT lower uterine segment intramural leiomyoma. Small nabothian cyst. Endometrium Thickness:  9 mm.  No focal abnormality visualized. Right ovary Measurements: 5.5 x 2.3 x 2.7 cm = volume: 18.1 mL. Normal appearance/no adnexal mass. Multiple follicles. Left ovary Measurements: 5.3 x 2.9 x 2.2 cm = volume: 17.6 mL. Normal appearance/no adnexal mass. Other findings No abnormal free fluid. IMPRESSION: 1. Intramural leiomyomas measuring to 3.1 cm. 2. Otherwise negative pelvic ultrasound. If bleeding remains unresponsive to hormonal or medical therapy, sonohysterogram should be considered for focal lesion work-up. (Ref: Radiological Reasoning: Algorithmic Workup of Abnormal Vaginal Bleeding with Endovaginal Sonography and Sonohysterography. AJR 2008; 188:C16-60) Electronically Signed   By: Elon Alas M.D.   On: 06/17/2018 17:09     Subjective: Pt reports that she is feeling a lot better after the transfusion.  She rested much better.  No CP, No SOB.   Discharge Exam: Vitals:   06/18/18 0009 06/18/18 0617  BP: 139/81 131/77  Pulse: 68 72  Resp: 20 18  Temp: 98.7 F (37.1 C) 98.6 F (37 C)  SpO2: 99% 98%   Vitals:   06/17/18 1825 06/17/18 2101 06/18/18 0009 06/18/18 0617  BP: 133/77 (!) 155/87 139/81 131/77  Pulse: 75 66 68 72  Resp: 14 20 20 18   Temp: 98.4 F (36.9 C) 98.8 F (37.1 C) 98.7 F (37.1 C)  98.6 F (37 C)  TempSrc: Oral Oral  Oral  SpO2: 100% 99% 99% 98%  Weight:      Height:       General: Pt is alert, awake, not in acute distress Cardiovascular: normal S1/S2 +, no rubs, no gallops Respiratory: CTA bilaterally, no wheezing, no rhonchi Abdominal: Soft, NT, ND, bowel sounds + Extremities: no edema, no cyanosis   The results of significant diagnostics from this hospitalization (including imaging, microbiology, ancillary and laboratory) are listed below for reference.     Microbiology: No results found for this or any previous visit (from the past 240 hour(s)).   Labs: BNP (last 3 results) No results for input(s): BNP in the last 8760 hours. Basic Metabolic Panel: Recent Labs  Lab 06/16/18 1500 06/16/18 1559 06/17/18 0505 06/18/18 0703  NA 136  --  134* 135  K 3.2*  --  3.4* 4.2  CL 100  --  103 105  CO2 22  --  22 23  GLUCOSE 145*  --  69* 156*  BUN 11  --  11 12  CREATININE 0.67  --  0.57 0.49  CALCIUM 8.8*  --  8.3* 9.2  MG  --  1.2* 1.7  --    Liver Function Tests: Recent Labs  Lab 06/16/18 1748 06/17/18 0505  AST 53* 32  ALT 26 19  ALKPHOS 81 65  BILITOT 0.9 1.2  PROT 8.7* 7.1  ALBUMIN 4.0 3.4*   No results for input(s): LIPASE, AMYLASE in the last 168 hours. No results for input(s): AMMONIA in the last 168 hours. CBC: Recent Labs  Lab 06/16/18 1500 06/17/18 0505 06/17/18 2204 06/18/18 0703  WBC 8.3 7.9  --  5.6  HGB 6.2* 7.3* 10.1* 9.9*  HCT 25.2* 26.7* 34.9* 34.4*  MCV 66.0* 72.2*  --  75.3*  PLT 45* 42*  --  75*   Cardiac Enzymes: No results for input(s): CKTOTAL, CKMB, CKMBINDEX, TROPONINI in the last 168 hours. BNP: Invalid input(s): POCBNP CBG: Recent Labs  Lab 06/16/18 2334 06/17/18 0724 06/17/18 1112 06/17/18 2130 06/18/18 0738  GLUCAP 83 65* 130* 114* 135*   D-Dimer No results for input(s): DDIMER in the last 72 hours. Hgb A1c  Recent Labs    06/16/18 1719  HGBA1C 4.4*   Lipid Profile No results for  input(s): CHOL, HDL, LDLCALC, TRIG, CHOLHDL, LDLDIRECT in the last 72 hours. Thyroid function studies Recent Labs    06/16/18 1748  TSH 0.843   Anemia work up Recent Labs    06/16/18 1500  VITAMINB12 529  FOLATE 6.9  FERRITIN 7*  TIBC 591*  IRON 15*  RETICCTPCT 2.6   Urinalysis    Component Value Date/Time   COLORURINE AMBER (A) 06/17/2018 0125   APPEARANCEUR HAZY (A) 06/17/2018 0125   LABSPEC 1.026 06/17/2018 0125   PHURINE 6.0 06/17/2018 0125   GLUCOSEU NEGATIVE 06/17/2018 0125   HGBUR MODERATE (A) 06/17/2018 0125   BILIRUBINUR NEGATIVE 06/17/2018 0125   KETONESUR NEGATIVE 06/17/2018 0125   PROTEINUR NEGATIVE 06/17/2018 0125   UROBILINOGEN 1.0 07/13/2008 0830   NITRITE NEGATIVE 06/17/2018 0125   LEUKOCYTESUR NEGATIVE 06/17/2018 0125   Sepsis Labs Invalid input(s): PROCALCITONIN,  WBC,  LACTICIDVEN Microbiology No results found for this or any previous visit (from the past 240 hour(s)).  Time coordinating discharge:   SIGNED:  Irwin Brakeman, MD  [facility] 06/18/2018, 8:49 AM Pager [phone]  If 7PM-7AM, please contact night-coverage [email or web address] Password [email or web address]

## 2018-06-18 NOTE — Telephone Encounter (Signed)
FOLLOW UP IN 4 MOS.. DX: ANEMIA/HYPERMENORRHEA/PLT DYSFUNCTION.

## 2018-06-18 NOTE — Discharge Instructions (Signed)
Please follow-up with the hematologist in 1 week for recheck. Please have all your prescriptions filled and take them as prescribed. Please follow-up with the gynecologist Dr. Glo Herring in 1 to 2 weeks. Please take your iron tablets regularly.    Abnormal Uterine Bleeding Abnormal uterine bleeding means bleeding more than usual from your uterus. It can include:  Bleeding between periods.  Bleeding after sex.  Bleeding that is heavier than normal.  Periods that last longer than usual.  Bleeding after you have stopped having your period (menopause).  There are many problems that may cause this. You should see a doctor for any kind of bleeding that is not normal. Treatment depends on the cause of the bleeding. Follow these instructions at home:  Watch your condition for any changes.  Do not use tampons, douche, or have sex, if your doctor tells you not to.  Change your pads often.  Get regular well-woman exams. Make sure they include a pelvic exam and cervical cancer screening.  Keep all follow-up visits as told by your doctor. This is important. Contact a doctor if:  The bleeding lasts more than one week.  You feel dizzy at times.  You feel like you are going to throw up (nauseous).  You throw up. Get help right away if:  You pass out.  You have to change pads every hour.  You have belly (abdominal) pain.  You have a fever.  You get sweaty.  You get weak.  You passing large blood clots from your vagina. Summary  Abnormal uterine bleeding means bleeding more than usual from your uterus.  There are many problems that may cause this. You should see a doctor for any kind of bleeding that is not normal.  Treatment depends on the cause of the bleeding. This information is not intended to replace advice given to you by your health care provider. Make sure you discuss any questions you have with your health care provider. Document Released: 05/24/2009 Document  Revised: 07/21/2016 Document Reviewed: 07/21/2016 Elsevier Interactive Patient Education  2017 [employer] Idiopathic Thrombocytopenic Purpura Idiopathic thrombocytopenic purpura (ITP) is a disease in which your bodys defense system (immune system) attacks your platelets. Platelets are blood cells that help form clots and seal leaks in damaged blood vessels. With ITP, you to have too few platelets. As a result, you bleed more easily. It is also harder for your body to stop any bleeding. In adults, ITP is usually a long-term disease. What are the causes? The cause is unknown. ITP may develop after a viral infection, during pregnancy, or from an immune disorder. What increases the risk? The risk of ITP may be greater among:  Women.  Adults 6-87 years old.  What are the signs or symptoms? Common signs and symptoms include:  Easy bruising.  A cut that bleeds for a long time.  Tiny purple blood dots (petechiae) under the skin, especially on the shins.  Blood in urine or bowel movements.  Nosebleeds.  Bleeding gums.  Heavy menstrual periods.  Mild forms of ITP may not cause any symptoms. How is this diagnosed? Your health care provider may suspect ITP based on your signs and symptoms. To make a diagnosis, your health care provider may do a physical exam and order blood tests to:  Find how many platelets you have.  See how well your blood clots.  Your health care provider may then do blood tests or a bone marrow test to rule out other conditions that could be  causing your symptoms. How is this treated? Treatment depends on the severity of your condition. Options include:  Monitoring of your condition and platelet count.  Blood transfusions of antibodies or platelets.  Medicines such as: ? Strong anti-inflammatory medicines (steroids). ? Medicines to boost platelet production. ? Medicines to suppress your immune system.  Removal of your spleen. This may be done if  other treatments do not work.  Follow these instructions at home:  Take medicines only as directed by your health care provider.  Do not take over-the-counter medicines that lower your platelet count, affect platelet function, or affect your bloods ability to clot. These include aspirin and ibuprofen.  Do not participate in contact sports or other high-risk activities. Ask your health care provider which activities are safe for you.  Keep all follow-up visits as directed by your health care provider. This is important. Contact a health care provider if:  You have new symptoms.  Your symptoms get worse. Get help right away if:  You have a sudden severe headache or confusion.  You have significant bleeding.  You have nausea and vomiting. This information is not intended to replace advice given to you by your health care provider. Make sure you discuss any questions you have with your health care provider. Document Released: 02/21/2014 Document Revised: 01/02/2016 Document Reviewed: 12/14/2013 Elsevier Interactive Patient Education  2018 [employer].   Anemia Anemia is a condition in which you do not have enough red blood cells or hemoglobin. Hemoglobin is a substance in red blood cells that carries oxygen. When you do not have enough red blood cells or hemoglobin (are anemic), your body cannot get enough oxygen and your organs may not work properly. As a result, you may feel very tired or have other problems. What are the causes? Common causes of anemia include:  Excessive bleeding. Anemia can be caused by excessive bleeding inside or outside the body, including bleeding from the intestine or from periods in women.  Poor nutrition.  Long-lasting (chronic) kidney, thyroid, and liver disease.  Bone marrow disorders.  Cancer and treatments for cancer.  HIV (human immunodeficiency virus) and AIDS (acquired immunodeficiency syndrome).  Treatments for HIV and AIDS.  Spleen  problems.  Blood disorders.  Infections, medicines, and autoimmune disorders that destroy red blood cells.  What are the signs or symptoms? Symptoms of this condition include:  Minor weakness.  Dizziness.  Headache.  Feeling heartbeats that are irregular or faster than normal (palpitations).  Shortness of breath, especially with exercise.  Paleness.  Cold sensitivity.  Indigestion.  Nausea.  Difficulty sleeping.  Difficulty concentrating.  Symptoms may occur suddenly or develop slowly. If your anemia is mild, you may not have symptoms. How is this diagnosed? This condition is diagnosed based on:  Blood tests.  Your medical history.  A physical exam.  Bone marrow biopsy.  Your health care provider may also check your stool (feces) for blood and may do additional testing to look for the cause of your bleeding. You may also have other tests, including:  Imaging tests, such as a CT scan or MRI.  Endoscopy.  Colonoscopy.  How is this treated? Treatment for this condition depends on the cause. If you continue to lose a lot of blood, you may need to be treated at a hospital. Treatment may include:  Taking supplements of iron, vitamin H47, or folic acid.  Taking a hormone medicine (erythropoietin) that can help to stimulate red blood cell growth.  Having a blood transfusion.  This may be needed if you lose a lot of blood.  Making changes to your diet.  Having surgery to remove your spleen.  Follow these instructions at home:  Take over-the-counter and prescription medicines only as told by your health care provider.  Take supplements only as told by your health care provider.  Follow any diet instructions that you were given.  Keep all follow-up visits as told by your health care provider. This is important. Contact a health care provider if:  You develop new bleeding anywhere in the body. Get help right away if:  You are very weak.  You are  short of breath.  You have pain in your abdomen or chest.  You are dizzy or feel faint.  You have trouble concentrating.  You have bloody or black, tarry stools.  You vomit repeatedly or you vomit up blood. Summary  Anemia is a condition in which you do not have enough red blood cells or enough of a substance in your red blood cells that carries oxygen (hemoglobin).  Symptoms may occur suddenly or develop slowly.  If your anemia is mild, you may not have symptoms.  This condition is diagnosed with blood tests as well as a medical history and physical exam. Other tests may be needed.  Treatment for this condition depends on the cause of the anemia. This information is not intended to replace advice given to you by your health care provider. Make sure you discuss any questions you have with your health care provider. Document Released: 09/03/2004 Document Revised: 08/28/2016 Document Reviewed: 08/28/2016 Elsevier Interactive Patient Education  2018 [employer].    Blood Transfusion, Adult A blood transfusion is a procedure in which you receive donated blood, including plasma, platelets, and red blood cells, through an IV tube. You may need a blood transfusion because of illness, surgery, or injury. The blood may come from a donor. You may also be able to donate blood for yourself (autologous blood donation) before a surgery if you know that you might require a blood transfusion. The blood given in a transfusion is made up of different types of cells. You may receive:  Red blood cells. These carry oxygen to the cells in the body.  White blood cells. These help you fight infections.  Platelets. These help your blood to clot.  Plasma. This is the liquid part of your blood and it helps with fluid imbalances.  If you have hemophilia or another clotting disorder, you may also receive other types of blood products. Tell a health care provider about:  Any allergies you have.  All  medicines you are taking, including vitamins, herbs, eye drops, creams, and over-the-counter medicines.  Any problems you or family members have had with anesthetic medicines.  Any blood disorders you have.  Any surgeries you have had.  Any medical conditions you have, including any recent fever or cold symptoms.  Whether you are pregnant or may be pregnant.  Any previous reactions you have had during a blood transfusion. What are the risks? Generally, this is a safe procedure. However, problems may occur, including:  Having an allergic reaction to something in the donated blood. Hives and itching may be symptoms of this type of reaction.  Fever. This may be a reaction to the white blood cells in the transfused blood. Nausea or chest pain may accompany a fever.  Iron overload. This can happen from having many transfusions.  Transfusion-related acute lung injury (TRALI). This is a rare reaction that  causes lung damage. The cause is not known.TRALI can occur within hours of a transfusion or several days later.  Sudden (acute) or delayed hemolytic reactions. This happens if your blood does not match the cells in your transfusion. Your bodys defense system (immune system) may try to attack the new cells. This complication is rare. The symptoms include fever, chills, nausea, and low back pain or chest pain.  Infection or disease transmission. This is rare.  What happens before the procedure?  You will have a blood test to determine your blood type. This is necessary to know what kind of blood your body will accept and to match it to the donor blood.  If you are going to have a planned surgery, you may be able to do an autologous blood donation. This may be done in case you need to have a transfusion.  If you have had an allergic reaction to a transfusion in the past, you may be given medicine to help prevent a reaction. This medicine may be given to you by mouth or through an IV  tube.  You will have your temperature, blood pressure, and pulse monitored before the transfusion.  Follow instructions from your health care provider about eating and drinking restrictions.  Ask your health care provider about: ? Changing or stopping your regular medicines. This is especially important if you are taking diabetes medicines or blood thinners. ? Taking medicines such as aspirin and ibuprofen. These medicines can thin your blood. Do not take these medicines before your procedure if your health care provider instructs you not to. What happens during the procedure?  An IV tube will be inserted into one of your veins.  The bag of donated blood will be attached to your IV tube. The blood will then enter through your vein.  Your temperature, blood pressure, and pulse will be monitored regularly during the transfusion. This monitoring is done to detect early signs of a transfusion reaction.  If you have any signs or symptoms of a reaction, your transfusion will be stopped and you may be given medicine.  When the transfusion is complete, your IV tube will be removed.  Pressure may be applied to the IV site for a few minutes.  A bandage (dressing) will be applied. The procedure may vary among health care providers and hospitals. What happens after the procedure?  Your temperature, blood pressure, heart rate, breathing rate, and blood oxygen level will be monitored often.  Your blood may be tested to see how you are responding to the transfusion.  You may be warmed with fluids or blankets to maintain a normal body temperature. Summary  A blood transfusion is a procedure in which you receive donated blood, including plasma, platelets, and red blood cells, through an IV tube.  Your temperature, blood pressure, and pulse will be monitored before, during, and after the transfusion.  Your blood may be tested after the transfusion to see how your body has responded. This  information is not intended to replace advice given to you by your health care provider. Make sure you discuss any questions you have with your health care provider. Document Released: 07/24/2000 Document Revised: 04/23/2016 Document Reviewed: 04/23/2016 Elsevier Interactive Patient Education  [employer].

## 2018-06-18 NOTE — Progress Notes (Signed)
Patient given discharge instructions. Patient verbalized understanding of instructions.  Patient discharged home with husband in stable condition.

## 2018-06-19 LAB — TYPE AND SCREEN
ABO/RH(D): A POS
Antibody Screen: NEGATIVE
UNIT DIVISION: 0
UNIT DIVISION: 0
UNIT DIVISION: 0
Unit division: 0

## 2018-06-19 LAB — BPAM RBC
BLOOD PRODUCT EXPIRATION DATE: 201912052359
Blood Product Expiration Date: 201912052359
Blood Product Expiration Date: 201912052359
Blood Product Expiration Date: 201912072359
ISSUE DATE / TIME: 201911081253
ISSUE DATE / TIME: 201911071847
ISSUE DATE / TIME: 201911072344
ISSUE DATE / TIME: 201911081801
UNIT TYPE AND RH: 6200
UNIT TYPE AND RH: 6200
Unit Type and Rh: 6200
Unit Type and Rh: 6200

## 2018-06-21 ENCOUNTER — Encounter: Payer: Self-pay | Admitting: Gastroenterology

## 2018-06-21 NOTE — Telephone Encounter (Signed)
PATIENT SCHEDULED  °

## 2018-10-18 NOTE — Progress Notes (Deleted)
history of anemia felt due to to likely heavy menses, previous transfusions, heart murmur, thrombocytopenia, history of alcoholism. Inpatient Nov 2019 with anemia and heme positive stool. Profound anemia during hospitalization with Hgb 6.2, platelets 45. Received 2 units PRBCs. IDA noted.

## 2018-10-20 ENCOUNTER — Ambulatory Visit: Payer: Medicaid Other | Admitting: Gastroenterology

## 2018-10-28 ENCOUNTER — Emergency Department (HOSPITAL_COMMUNITY): Payer: Medicaid Other

## 2018-10-28 ENCOUNTER — Other Ambulatory Visit: Payer: Self-pay

## 2018-10-28 ENCOUNTER — Encounter (HOSPITAL_COMMUNITY): Payer: Self-pay | Admitting: Emergency Medicine

## 2018-10-28 ENCOUNTER — Emergency Department (HOSPITAL_COMMUNITY)
Admission: EM | Admit: 2018-10-28 | Discharge: 2018-10-28 | Disposition: A | Discharge status: Home / Self Care | Payer: Medicaid Other | Attending: Emergency Medicine | Admitting: Emergency Medicine

## 2018-10-28 DIAGNOSIS — D649 Anemia, unspecified: Secondary | ICD-10-CM | POA: Diagnosis not present

## 2018-10-28 DIAGNOSIS — N939 Abnormal uterine and vaginal bleeding, unspecified: Secondary | ICD-10-CM | POA: Diagnosis not present

## 2018-10-28 DIAGNOSIS — E876 Hypokalemia: Secondary | ICD-10-CM | POA: Diagnosis not present

## 2018-10-28 DIAGNOSIS — R079 Chest pain, unspecified: Secondary | ICD-10-CM | POA: Diagnosis not present

## 2018-10-28 DIAGNOSIS — Z79899 Other long term (current) drug therapy: Secondary | ICD-10-CM | POA: Insufficient documentation

## 2018-10-28 DIAGNOSIS — R1110 Vomiting, unspecified: Secondary | ICD-10-CM | POA: Diagnosis not present

## 2018-10-28 LAB — URINALYSIS, ROUTINE W REFLEX MICROSCOPIC
PH: 7 (ref 5.0–8.0)
SPECIFIC GRAVITY, URINE: 1.015 (ref 1.005–1.030)

## 2018-10-28 LAB — BASIC METABOLIC PANEL
Anion gap: 13 (ref 5–15)
BUN: 10 mg/dL (ref 6–20)
CALCIUM: 8.8 mg/dL — AB (ref 8.9–10.3)
CO2: 26 mmol/L (ref 22–32)
CREATININE: 0.68 mg/dL (ref 0.44–1.00)
Chloride: 95 mmol/L — ABNORMAL LOW (ref 98–111)
GFR calc Af Amer: >60 mL/min (ref >60)
GFR calc non Af Amer: >60 mL/min (ref >60)
Glucose, Bld: 105 mg/dL — ABNORMAL HIGH (ref 70–99)
Potassium: 2.8 mmol/L — ABNORMAL LOW (ref 3.5–5.1)
Sodium: 134 mmol/L — ABNORMAL LOW (ref 135–145)

## 2018-10-28 LAB — CBC WITH DIFFERENTIAL/PLATELET
Abs Immature Granulocytes: 0.03 10*3/uL (ref 0.00–0.07)
BASOS PCT: 0 %
Basophils Absolute: 0 10*3/uL (ref 0.0–0.1)
EOS ABS: 0 10*3/uL (ref 0.0–0.5)
EOS PCT: 0 %
HEMATOCRIT: 27.7 % — AB (ref 36.0–46.0)
Hemoglobin: 7.8 g/dL — ABNORMAL LOW (ref 12.0–15.0)
Immature Granulocytes: 1 %
LYMPHS ABS: 0.8 10*3/uL (ref 0.7–4.0)
Lymphocytes Relative: 13 %
MCH: 20.2 pg — ABNORMAL LOW (ref 26.0–34.0)
MCHC: 28.2 g/dL — AB (ref 30.0–36.0)
MCV: 71.6 fL — ABNORMAL LOW (ref 80.0–100.0)
Monocytes Absolute: 0.5 10*3/uL (ref 0.1–1.0)
Monocytes Relative: 8 %
NRBC: 0 % (ref 0.0–0.2)
Neutro Abs: 4.9 10*3/uL (ref 1.7–7.7)
Neutrophils Relative %: 78 %
PLATELETS: 125 10*3/uL — AB (ref 150–400)
RBC: 3.87 MIL/uL (ref 3.87–5.11)
RDW: 18 % — AB (ref 11.5–15.5)
WBC: 6.3 10*3/uL (ref 4.0–10.5)

## 2018-10-28 LAB — MAGNESIUM: Magnesium: 1.2 mg/dL — ABNORMAL LOW (ref 1.7–2.4)

## 2018-10-28 LAB — HEPATIC FUNCTION PANEL
ALT: 74 U/L — ABNORMAL HIGH (ref 0–44)
AST: 145 U/L — ABNORMAL HIGH (ref 15–41)
Albumin: 4.2 g/dL (ref 3.5–5.0)
Alkaline Phosphatase: 99 U/L (ref 38–126)
BILIRUBIN DIRECT: 0.4 mg/dL — AB (ref 0.0–0.2)
Indirect Bilirubin: 0.8 mg/dL (ref 0.3–0.9)
Total Bilirubin: 1.2 mg/dL (ref 0.3–1.2)
Total Protein: 8.3 g/dL — ABNORMAL HIGH (ref 6.5–8.1)

## 2018-10-28 LAB — AMMONIA: Ammonia: 23 umol/L (ref 9–35)

## 2018-10-28 LAB — URINALYSIS, MICROSCOPIC (REFLEX)
Bacteria, UA: NONE SEEN
WBC, UA: >50 WBC/hpf (ref 0–5)

## 2018-10-28 LAB — LIPASE, BLOOD: Lipase: 34 U/L (ref 11–51)

## 2018-10-28 LAB — TROPONIN I: Troponin I: <0.03 ng/mL (ref <0.03)

## 2018-10-28 LAB — TYPE AND SCREEN
ABO/RH(D): A POS
Antibody Screen: NEGATIVE

## 2018-10-28 LAB — HCG, QUANTITATIVE, PREGNANCY

## 2018-10-28 MED ORDER — POTASSIUM CHLORIDE 10 MEQ/100ML IV SOLN
10.0000 meq | INTRAVENOUS | Status: AC
  Administered 2018-10-28 (×2): 10 meq via INTRAVENOUS
  Filled 2018-10-28 (×2): qty 100

## 2018-10-28 MED ORDER — MAGNESIUM SULFATE 2 GM/50ML IV SOLN
2.0000 g | Freq: Once | INTRAVENOUS | Status: AC
  Administered 2018-10-28: 2 g via INTRAVENOUS
  Filled 2018-10-28: qty 50

## 2018-10-28 MED ORDER — POTASSIUM CHLORIDE CRYS ER 20 MEQ PO TBCR
40.0000 meq | EXTENDED_RELEASE_TABLET | Freq: Once | ORAL | Status: AC
  Administered 2018-10-28: 40 meq via ORAL
  Filled 2018-10-28: qty 2

## 2018-10-28 MED ORDER — LIDOCAINE VISCOUS HCL 2 % MT SOLN
15.0000 mL | Freq: Once | OROMUCOSAL | Status: AC
  Administered 2018-10-28: 15 mL via ORAL
  Filled 2018-10-28: qty 15

## 2018-10-28 MED ORDER — PANTOPRAZOLE SODIUM 20 MG PO TBEC: 20.0000 mg | DELAYED_RELEASE_TABLET | Freq: Every day | ORAL | 0 refills | Status: DC

## 2018-10-28 MED ORDER — ALUM & MAG HYDROXIDE-SIMETH 200-200-20 MG/5ML PO SUSP
30.0000 mL | Freq: Once | ORAL | Status: AC
  Administered 2018-10-28: 30 mL via ORAL
  Filled 2018-10-28: qty 30

## 2018-10-28 MED ORDER — SODIUM CHLORIDE 0.9 % IV BOLUS
1000.0000 mL | Freq: Once | INTRAVENOUS | Status: AC
  Administered 2018-10-28: 1000 mL via INTRAVENOUS

## 2018-10-28 MED ORDER — ALPRAZOLAM 0.25 MG PO TABS: 0.2500 mg | ORAL_TABLET | Freq: Three times a day (TID) | ORAL | 0 refills | Status: DC | PRN

## 2018-10-28 MED ORDER — LORAZEPAM 2 MG/ML IJ SOLN
0.5000 mg | Freq: Once | INTRAMUSCULAR | Status: AC
  Administered 2018-10-28: 0.5 mg via INTRAVENOUS
  Filled 2018-10-28: qty 1
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
## 2018-10-28 NOTE — ED Triage Notes (Signed)
Pt c/o of vaginal bleeding 23 days ago and been throwing up and having chest pain the past 2 days. Pt describes bleeding as "clots and coming out fast".

## 2018-10-28 NOTE — ED Notes (Signed)
Reports drinks daily  Last drink yesterday reports "I think I have a problem"  Reports bleeding for the last 23 days - has not seen PCP regarding  Has not done home pregnancy

## 2018-10-28 NOTE — ED Notes (Signed)
Meal provided 

## 2018-10-29 LAB — APTT: aPTT: 33 seconds (ref 24–36)

## 2018-10-29 LAB — PROTIME-INR
INR: 1.1 (ref 0.8–1.2)
Prothrombin Time: 14.1 seconds (ref 11.4–15.2)

## 2018-10-31 ENCOUNTER — Telehealth: Payer: Self-pay | Admitting: Obstetrics and Gynecology

## 2018-10-31 ENCOUNTER — Telehealth: Payer: Self-pay | Admitting: *Deleted

## 2018-10-31 NOTE — Telephone Encounter (Signed)
Mailbox full @ 2:20. I called 1st # listed. Unable to leave message about no visitors or children at appt. [place]

## 2018-10-31 NOTE — Telephone Encounter (Signed)
Phone call to both listed numbers : Not a Working number, and " unavailable". Pt was counselled in ED to seek care in [place].

## 2018-11-01 ENCOUNTER — Other Ambulatory Visit: Payer: Self-pay

## 2018-11-01 ENCOUNTER — Other Ambulatory Visit: Payer: Self-pay | Admitting: Obstetrics and Gynecology

## 2018-11-01 ENCOUNTER — Ambulatory Visit (INDEPENDENT_AMBULATORY_CARE_PROVIDER_SITE_OTHER): Payer: Medicaid Other | Admitting: Obstetrics and Gynecology

## 2018-11-01 ENCOUNTER — Encounter: Payer: Self-pay | Admitting: Obstetrics and Gynecology

## 2018-11-01 ENCOUNTER — Observation Stay (HOSPITAL_COMMUNITY)
Admission: AD | Admit: 2018-11-01 | Discharge: 2018-11-03 | Disposition: A | Discharge status: Home / Self Care | Payer: Medicaid Other | Source: Ambulatory Visit | Attending: Obstetrics and Gynecology | Admitting: Obstetrics and Gynecology

## 2018-11-01 VITALS — BP 114/75 | HR 99 | Temp 98.8°F | Ht 67.0 in | Wt 174.8 lb

## 2018-11-01 DIAGNOSIS — Z881 Allergy status to other antibiotic agents status: Secondary | ICD-10-CM | POA: Diagnosis not present

## 2018-11-01 DIAGNOSIS — Z882 Allergy status to sulfonamides status: Secondary | ICD-10-CM | POA: Diagnosis not present

## 2018-11-01 DIAGNOSIS — D649 Anemia, unspecified: Secondary | ICD-10-CM

## 2018-11-01 DIAGNOSIS — Z79899 Other long term (current) drug therapy: Secondary | ICD-10-CM | POA: Insufficient documentation

## 2018-11-01 DIAGNOSIS — E876 Hypokalemia: Secondary | ICD-10-CM | POA: Insufficient documentation

## 2018-11-01 DIAGNOSIS — N920 Excessive and frequent menstruation with regular cycle: Secondary | ICD-10-CM | POA: Insufficient documentation

## 2018-11-01 DIAGNOSIS — N939 Abnormal uterine and vaginal bleeding, unspecified: Secondary | ICD-10-CM

## 2018-11-01 DIAGNOSIS — D259 Leiomyoma of uterus, unspecified: Secondary | ICD-10-CM | POA: Diagnosis not present

## 2018-11-01 DIAGNOSIS — F10988 Alcohol use, unspecified with other alcohol-induced disorder: Secondary | ICD-10-CM | POA: Diagnosis not present

## 2018-11-01 DIAGNOSIS — D500 Iron deficiency anemia secondary to blood loss (chronic): Principal | ICD-10-CM | POA: Insufficient documentation

## 2018-11-01 DIAGNOSIS — N840 Polyp of corpus uteri: Secondary | ICD-10-CM

## 2018-11-01 LAB — COMPREHENSIVE METABOLIC PANEL
ALT: 67 U/L — ABNORMAL HIGH (ref 0–44)
AST: 133 U/L — AB (ref 15–41)
Albumin: 3.7 g/dL (ref 3.5–5.0)
Alkaline Phosphatase: 82 U/L (ref 38–126)
Anion gap: 12 (ref 5–15)
BUN: 10 mg/dL (ref 6–20)
CO2: 25 mmol/L (ref 22–32)
Calcium: 8.4 mg/dL — ABNORMAL LOW (ref 8.9–10.3)
Chloride: 96 mmol/L — ABNORMAL LOW (ref 98–111)
Creatinine, Ser: 0.56 mg/dL (ref 0.44–1.00)
GFR calc Af Amer: >60 mL/min (ref >60)
Glucose, Bld: 103 mg/dL — ABNORMAL HIGH (ref 70–99)
Potassium: 2.7 mmol/L — CL (ref 3.5–5.1)
Sodium: 133 mmol/L — ABNORMAL LOW (ref 135–145)
Total Bilirubin: 0.7 mg/dL (ref 0.3–1.2)
Total Protein: 7.4 g/dL (ref 6.5–8.1)

## 2018-11-01 LAB — CBC
HCT: 18.7 % — ABNORMAL LOW (ref 36.0–46.0)
Hemoglobin: 5.2 g/dL — CL (ref 12.0–15.0)
MCH: 19.8 pg — ABNORMAL LOW (ref 26.0–34.0)
MCHC: 27.8 g/dL — ABNORMAL LOW (ref 30.0–36.0)
MCV: 71.4 fL — ABNORMAL LOW (ref 80.0–100.0)
Platelets: 138 10*3/uL — ABNORMAL LOW (ref 150–400)
RBC: 2.62 MIL/uL — ABNORMAL LOW (ref 3.87–5.11)
RDW: 18 % — ABNORMAL HIGH (ref 11.5–15.5)
WBC: 4.3 10*3/uL (ref 4.0–10.5)
nRBC: 0.5 % — ABNORMAL HIGH (ref 0.0–0.2)

## 2018-11-01 LAB — PREPARE RBC (CROSSMATCH)

## 2018-11-01 LAB — PROTIME-INR
INR: 1.1 (ref 0.8–1.2)
Prothrombin Time: 14.3 seconds (ref 11.4–15.2)

## 2018-11-01 LAB — APTT: aPTT: 31 seconds (ref 24–36)

## 2018-11-01 MED ORDER — PRENATAL 27-0.8 MG PO TABS
1.0000 | ORAL_TABLET | Freq: Every day | ORAL | Status: DC
  Filled 2018-11-01: qty 1

## 2018-11-01 MED ORDER — ACETAMINOPHEN 325 MG PO TABS: 650.0000 mg | ORAL_TABLET | Freq: Once | ORAL | Status: DC

## 2018-11-01 MED ORDER — ONDANSETRON HCL 4 MG PO TABS: 4.0000 mg | ORAL_TABLET | Freq: Four times a day (QID) | ORAL | Status: DC | PRN

## 2018-11-01 MED ORDER — SODIUM CHLORIDE 0.9% IV SOLUTION: Freq: Once | INTRAVENOUS | Status: DC

## 2018-11-01 MED ORDER — IBUPROFEN 200 MG PO TABS: 600.0000 mg | ORAL_TABLET | Freq: Four times a day (QID) | ORAL | Status: DC | PRN

## 2018-11-01 MED ORDER — TRANEXAMIC ACID 650 MG PO TABS
1300.0000 mg | ORAL_TABLET | Freq: Three times a day (TID) | ORAL | Status: DC
  Administered 2018-11-02 – 2018-11-03 (×5): 1300 mg via ORAL
  Filled 2018-11-01 (×10): qty 2

## 2018-11-01 MED ORDER — MAGNESIUM HYDROXIDE 400 MG/5ML PO SUSP: 30.0000 mL | Freq: Every day | ORAL | Status: DC | PRN

## 2018-11-01 MED ORDER — ESTROGENS CONJUGATED 25 MG IJ SOLR
25.0000 mg | Freq: Four times a day (QID) | INTRAMUSCULAR | Status: AC
  Administered 2018-11-01 – 2018-11-02 (×2): 25 mg via INTRAVENOUS
  Filled 2018-11-01 (×5): qty 25

## 2018-11-01 MED ORDER — SODIUM CHLORIDE 0.9 % IV SOLN: INTRAVENOUS | Status: DC

## 2018-11-01 MED ORDER — HYDROCODONE-ACETAMINOPHEN 5-325 MG PO TABS: 1.0000 | ORAL_TABLET | ORAL | Status: DC | PRN

## 2018-11-01 MED ORDER — TRAMADOL HCL 50 MG PO TABS
50.0000 mg | ORAL_TABLET | Freq: Four times a day (QID) | ORAL | Status: DC | PRN
  Administered 2018-11-01 – 2018-11-02 (×2): 50 mg via ORAL
  Filled 2018-11-01 (×2): qty 1

## 2018-11-01 MED ORDER — ZOLPIDEM TARTRATE 5 MG PO TABS
5.0000 mg | ORAL_TABLET | Freq: Every evening | ORAL | Status: DC | PRN
  Administered 2018-11-02: 5 mg via ORAL
  Filled 2018-11-01: qty 1

## 2018-11-01 MED ORDER — PRENATAL MULTIVITAMIN CH: 1.0000 | ORAL_TABLET | Freq: Every day | ORAL | Status: DC

## 2018-11-01 MED ORDER — ACETAMINOPHEN 325 MG PO TABS
650.0000 mg | ORAL_TABLET | ORAL | Status: DC | PRN
  Administered 2018-11-01: 650 mg via ORAL
  Filled 2018-11-01: qty 2

## 2018-11-01 MED ORDER — PRENATAL 27-0.8 MG PO TABS
1.0000 | ORAL_TABLET | Freq: Every day | ORAL | Status: DC
  Administered 2018-11-01: 1 via ORAL
  Filled 2018-11-01 (×2): qty 1

## 2018-11-01 MED ORDER — KETOROLAC TROMETHAMINE 15 MG/ML IJ SOLN
15.0000 mg | Freq: Once | INTRAMUSCULAR | Status: AC
  Administered 2018-11-02: 15 mg via INTRAVENOUS
  Filled 2018-11-01: qty 1

## 2018-11-01 MED ORDER — TRAMADOL HCL 50 MG PO TABS: 50.0000 mg | ORAL_TABLET | Freq: Four times a day (QID) | ORAL | Status: DC | PRN

## 2018-11-01 MED ORDER — PROMETHAZINE HCL 25 MG/ML IJ SOLN: 12.5000 mg | Freq: Four times a day (QID) | INTRAMUSCULAR | Status: DC | PRN

## 2018-11-01 MED ORDER — SODIUM CHLORIDE 0.9 % IV SOLN
INTRAVENOUS | Status: DC
  Administered 2018-11-01 – 2018-11-02 (×2): via INTRAVENOUS

## 2018-11-01 MED ORDER — ONDANSETRON HCL 4 MG/2ML IJ SOLN
4.0000 mg | Freq: Four times a day (QID) | INTRAMUSCULAR | Status: DC | PRN
  Administered 2018-11-01 – 2018-11-03 (×4): 4 mg via INTRAVENOUS
  Filled 2018-11-01 (×4): qty 2

## 2018-11-01 MED ORDER — SODIUM CHLORIDE 0.9% IV SOLUTION
Freq: Once | INTRAVENOUS | Status: AC
  Administered 2018-11-01: 19:00:00 via INTRAVENOUS

## 2018-11-01 MED ORDER — ESTROGENS CONJUGATED 25 MG IJ SOLR: 25.0000 mg | Freq: Four times a day (QID) | INTRAMUSCULAR | Status: DC

## 2018-11-01 MED ORDER — ACETAMINOPHEN 325 MG PO TABS: 650.0000 mg | ORAL_TABLET | ORAL | Status: DC | PRN

## 2018-11-01 MED ORDER — POTASSIUM CHLORIDE CRYS ER 20 MEQ PO TBCR
20.0000 meq | EXTENDED_RELEASE_TABLET | Freq: Three times a day (TID) | ORAL | Status: AC
  Administered 2018-11-01 – 2018-11-02 (×4): 20 meq via ORAL
  Filled 2018-11-01 (×4): qty 1
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
## 2018-11-01 NOTE — Progress Notes (Signed)
Dr Glo Herring contacted with critical lab values at this time

## 2018-11-01 NOTE — Addendum Note (Signed)
Addended by: Diona Fanti A on: 11/01/2018 12:21 PM   Modules accepted: Orders
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
## 2018-11-01 NOTE — Progress Notes (Signed)
Brittney Melendez is a 43 year old female recently seen in the emergency room for symptomatic anemia, was 7.8 on her hemoglobin and sent home from the hospital patient returns to the office and is complaining of symptomatic anemia with fatigue heart racing and shortness of breath with normal house activities.  She is going to be admitted for transfusions x3 units packed cells, at least along with IV Premarin and tranexamic acid to attempt to control the bleeding.  Endometrial biopsy was done in the office today.  See office note which will be converted to history

## 2018-11-02 DIAGNOSIS — D500 Iron deficiency anemia secondary to blood loss (chronic): Secondary | ICD-10-CM | POA: Diagnosis not present

## 2018-11-02 DIAGNOSIS — D649 Anemia, unspecified: Secondary | ICD-10-CM | POA: Diagnosis not present

## 2018-11-02 LAB — COMPREHENSIVE METABOLIC PANEL
ALT: 57 U/L — ABNORMAL HIGH (ref 0–44)
AST: 98 U/L — ABNORMAL HIGH (ref 15–41)
Albumin: 3.7 g/dL (ref 3.5–5.0)
Alkaline Phosphatase: 79 U/L (ref 38–126)
Anion gap: 7 (ref 5–15)
BUN: 13 mg/dL (ref 6–20)
CO2: 25 mmol/L (ref 22–32)
Calcium: 8.2 mg/dL — ABNORMAL LOW (ref 8.9–10.3)
Chloride: 100 mmol/L (ref 98–111)
Creatinine, Ser: 0.61 mg/dL (ref 0.44–1.00)
GFR calc Af Amer: >60 mL/min (ref >60)
GFR calc non Af Amer: >60 mL/min (ref >60)
Glucose, Bld: 94 mg/dL (ref 70–99)
Potassium: 3.3 mmol/L — ABNORMAL LOW (ref 3.5–5.1)
Sodium: 132 mmol/L — ABNORMAL LOW (ref 135–145)
Total Bilirubin: 1.4 mg/dL — ABNORMAL HIGH (ref 0.3–1.2)
Total Protein: 7.1 g/dL (ref 6.5–8.1)

## 2018-11-02 LAB — HEMOGLOBIN AND HEMATOCRIT, BLOOD
HCT: 27.2 % — ABNORMAL LOW (ref 36.0–46.0)
HEMOGLOBIN: 8.3 g/dL — AB (ref 12.0–15.0)

## 2018-11-02 LAB — MAGNESIUM: Magnesium: 1.2 mg/dL — ABNORMAL LOW (ref 1.7–2.4)

## 2018-11-02 MED ORDER — FOLIC ACID 1 MG PO TABS
1.0000 mg | ORAL_TABLET | Freq: Every day | ORAL | Status: DC
  Administered 2018-11-03: 1 mg via ORAL
  Filled 2018-11-02: qty 1

## 2018-11-02 MED ORDER — LORAZEPAM 2 MG/ML IJ SOLN: 1.0000 mg | Freq: Four times a day (QID) | INTRAMUSCULAR | Status: DC | PRN

## 2018-11-02 MED ORDER — POVIDONE-IODINE 10 % EX SOLN
CUTANEOUS | Status: AC
  Administered 2018-11-02: 08:00:00
  Filled 2018-11-02: qty 118

## 2018-11-02 MED ORDER — THIAMINE HCL 100 MG/ML IJ SOLN
100.0000 mg | Freq: Every day | INTRAMUSCULAR | Status: DC
  Filled 2018-11-02: qty 2

## 2018-11-02 MED ORDER — VITAMIN B-1 100 MG PO TABS
100.0000 mg | ORAL_TABLET | Freq: Every day | ORAL | Status: DC
  Administered 2018-11-02: 100 mg via ORAL
  Filled 2018-11-02: qty 1

## 2018-11-02 MED ORDER — POTASSIUM CHLORIDE IN NACL 40-0.9 MEQ/L-% IV SOLN
INTRAVENOUS | Status: AC
  Administered 2018-11-02: 100 mL/h via INTRAVENOUS

## 2018-11-02 MED ORDER — LEUPROLIDE ACETATE 3.75 MG IM KIT
3.7500 mg | PACK | Freq: Once | INTRAMUSCULAR | Status: AC
  Administered 2018-11-03: 3.75 mg via INTRAMUSCULAR
  Filled 2018-11-02: qty 3.75

## 2018-11-02 MED ORDER — ADULT MULTIVITAMIN W/MINERALS CH
1.0000 | ORAL_TABLET | Freq: Every day | ORAL | Status: DC
  Administered 2018-11-03: 1 via ORAL
  Filled 2018-11-02: qty 1

## 2018-11-02 MED ORDER — ALPRAZOLAM 0.25 MG PO TABS
0.2500 mg | ORAL_TABLET | Freq: Three times a day (TID) | ORAL | Status: DC | PRN
  Administered 2018-11-03: 0.25 mg via ORAL
  Filled 2018-11-02: qty 1

## 2018-11-02 MED ORDER — LORAZEPAM 1 MG PO TABS: 1.0000 mg | ORAL_TABLET | Freq: Four times a day (QID) | ORAL | Status: DC | PRN

## 2018-11-02 MED ORDER — ESTROGENS CONJUGATED 25 MG IJ SOLR: 25.0000 mg | Freq: Four times a day (QID) | INTRAMUSCULAR | Status: DC

## 2018-11-02 MED ORDER — MUSCLE RUB 10-15 % EX CREA
TOPICAL_CREAM | CUTANEOUS | Status: DC | PRN
  Filled 2018-11-02: qty 85

## 2018-11-02 MED ORDER — LEUPROLIDE ACETATE 3.75 MG IM KIT: 3.7500 mg | PACK | Freq: Once | INTRAMUSCULAR | Status: DC

## 2018-11-02 MED ORDER — VITAMIN B-1 100 MG PO TABS
100.0000 mg | ORAL_TABLET | Freq: Every day | ORAL | Status: DC
  Administered 2018-11-03: 100 mg via ORAL
  Filled 2018-11-02: qty 1

## 2018-11-02 MED ORDER — MAGNESIUM SULFATE 2 GM/50ML IV SOLN
2.0000 g | Freq: Once | INTRAVENOUS | Status: AC
  Administered 2018-11-02: 2 g via INTRAVENOUS
  Filled 2018-11-02: qty 50

## 2018-11-02 MED ORDER — ADULT MULTIVITAMIN W/MINERALS CH
1.0000 | ORAL_TABLET | Freq: Every day | ORAL | Status: DC
  Administered 2018-11-02: 1 via ORAL
  Filled 2018-11-02: qty 1

## 2018-11-02 MED ORDER — FOLIC ACID 1 MG PO TABS
1.0000 mg | ORAL_TABLET | Freq: Every day | ORAL | Status: DC
  Administered 2018-11-02: 1 mg via ORAL
  Filled 2018-11-02: qty 1

## 2018-11-02 MED ORDER — ESTROGENS CONJUGATED 25 MG IJ SOLR
25.0000 mg | Freq: Four times a day (QID) | INTRAMUSCULAR | Status: DC
  Administered 2018-11-02 – 2018-11-03 (×4): 25 mg via INTRAVENOUS
  Filled 2018-11-02 (×12): qty 25

## 2018-11-02 NOTE — Progress Notes (Signed)
Hospital day 2 after admission for symptomatic anemia due to persistent vaginal bleeding associated with uterine fibroids, and chronic alcohol use.  Subjective: Patient reports still passing 2 cm clots when going to bathroom. This is less than what was being done in office yesterday. Feels better. No alcohol withdrawal symptoms. Pt reports she drinks 3 glasses of wine or vodka daily..    Objective: I have reviewed patient's vital signs and medications. Currently on Lysteda PO 1300 mg tid, IV premarin  25 mg qid.  CMP Latest Ref Rng & Units 11/01/2018 10/28/2018 06/18/2018  Glucose 70 - 99 mg/dL 103(H) 105(H) 156(H)  BUN 6 - 20 mg/dL 10 10 12   Creatinine 0.44 - 1.00 mg/dL 0.56 0.68 0.49  Sodium 135 - 145 mmol/L 133(L) 134(L) 135  Potassium 3.5 - 5.1 mmol/L 2.7(LL) 2.8(L) 4.2  Chloride 98 - 111 mmol/L 96(L) 95(L) 105  CO2 22 - 32 mmol/L 25 26 23   Calcium 8.9 - 10.3 mg/dL 8.4(L) 8.8(L) 9.2  Total Protein 6.5 - 8.1 g/dL 7.4 8.3(H) -  Total Bilirubin 0.3 - 1.2 mg/dL 0.7 1.2 -  Alkaline Phos 38 - 126 U/L 82 99 -  AST 15 - 41 U/L 133(H) 145(H) -  ALT 0 - 44 U/L 67(H) 74(H) -    CBC Latest Ref Rng & Units 11/02/2018 11/01/2018 10/28/2018  WBC 4.0 - 10.5 K/uL - 4.3 6.3  Hemoglobin 12.0 - 15.0 g/dL 8.3(L) 5.2(LL) 7.8(L)  Hematocrit 36.0 - 46.0 % 27.2(L) 18.7(L) 27.7(L)  Platelets 150 - 400 K/uL - 138(L) 125(L)      General: alert, cooperative and no distress GI: soft, non-tender; bowel sounds normal; no masses,  no organomegaly Vaginal Bleeding: moderate Speculum exam: dark blood at os, no active bright blood. Vaginal packing placed, betadine soaked, 6 4x4's placed as tamponade for today.  Assessment/Plan: 1. Acute on chronic anemia, due to vaginal bleeding. Currently improving since transfusions. Continue IV premarin, vag packing, po  2. Hypokalemia. Currently on KDur 20 tid po. Will add IV potassium 40 meq in 1000 cc at 100/hr   And continue po replacement 3 elevated transaminases, chronic.  Suspect due to alcohol use. Will add bag of MVI. 4 foot paresthesias, suspect due to low K and is cramping.  LOS: 0 days    [name] 11/02/2018, 8:09 AM
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
## 2018-11-03 DIAGNOSIS — D649 Anemia, unspecified: Secondary | ICD-10-CM | POA: Diagnosis not present

## 2018-11-03 DIAGNOSIS — D500 Iron deficiency anemia secondary to blood loss (chronic): Secondary | ICD-10-CM | POA: Diagnosis not present

## 2018-11-03 LAB — COMPREHENSIVE METABOLIC PANEL
ALT: 40 U/L (ref 0–44)
ANION GAP: 9 (ref 5–15)
AST: 68 U/L — ABNORMAL HIGH (ref 15–41)
Albumin: 3.4 g/dL — ABNORMAL LOW (ref 3.5–5.0)
Alkaline Phosphatase: 70 U/L (ref 38–126)
BUN: 9 mg/dL (ref 6–20)
CO2: 25 mmol/L (ref 22–32)
Calcium: 8.5 mg/dL — ABNORMAL LOW (ref 8.9–10.3)
Chloride: 101 mmol/L (ref 98–111)
Creatinine, Ser: 0.62 mg/dL (ref 0.44–1.00)
GFR calc Af Amer: >60 mL/min (ref >60)
GFR calc non Af Amer: >60 mL/min (ref >60)
GLUCOSE: 79 mg/dL (ref 70–99)
Potassium: 4.3 mmol/L (ref 3.5–5.1)
Sodium: 135 mmol/L (ref 135–145)
TOTAL PROTEIN: 6.6 g/dL (ref 6.5–8.1)
Total Bilirubin: 1.3 mg/dL — ABNORMAL HIGH (ref 0.3–1.2)

## 2018-11-03 LAB — TYPE AND SCREEN
ABO/RH(D): A POS
Antibody Screen: NEGATIVE
Unit division: 0
Unit division: 0

## 2018-11-03 LAB — BPAM RBC
BLOOD PRODUCT EXPIRATION DATE: 202004172359
Blood Product Expiration Date: 202004132359
ISSUE DATE / TIME: 202003241824
ISSUE DATE / TIME: 202003250031
Unit Type and Rh: 600
Unit Type and Rh: 6200

## 2018-11-03 LAB — CBC
HCT: 27.1 % — ABNORMAL LOW (ref 36.0–46.0)
Hemoglobin: 8 g/dL — ABNORMAL LOW (ref 12.0–15.0)
MCH: 22.5 pg — ABNORMAL LOW (ref 26.0–34.0)
MCHC: 29.5 g/dL — ABNORMAL LOW (ref 30.0–36.0)
MCV: 76.3 fL — ABNORMAL LOW (ref 80.0–100.0)
PLATELETS: 178 10*3/uL (ref 150–400)
RBC: 3.55 MIL/uL — ABNORMAL LOW (ref 3.87–5.11)
RDW: 52.1 % — ABNORMAL HIGH (ref 11.5–15.5)
WBC: 7.8 10*3/uL (ref 4.0–10.5)
nRBC: 0.3 % — ABNORMAL HIGH (ref 0.0–0.2)

## 2018-11-03 LAB — MAGNESIUM: MAGNESIUM: 1.4 mg/dL — AB (ref 1.7–2.4)

## 2018-11-03 MED ORDER — MEGESTROL ACETATE 40 MG PO TABS: 40.0000 mg | ORAL_TABLET | Freq: Three times a day (TID) | ORAL | 2 refills | Status: DC

## 2018-11-03 MED ORDER — MAGNESIUM SULFATE 2 GM/50ML IV SOLN
2.0000 g | Freq: Once | INTRAVENOUS | Status: AC
  Administered 2018-11-03: 2 g via INTRAVENOUS
  Filled 2018-11-03: qty 50

## 2018-11-03 MED ORDER — SODIUM CHLORIDE 0.45 % IV SOLN: INTRAVENOUS | Status: DC

## 2018-11-03 MED ORDER — ALPRAZOLAM 0.25 MG PO TABS: 0.2500 mg | ORAL_TABLET | Freq: Three times a day (TID) | ORAL | 0 refills | Status: DC | PRN

## 2018-11-03 NOTE — Progress Notes (Signed)
PROGRESS NOTE    Brittney Melendez  AST:[id number] DOB: 07/05/1976 DOA: 11/01/2018 PCP: Patient, No Pcp Per   Brief Narrative:  Per HPI: Brittney Melendez is an 43 y.o. female with history of anemia and menorrhagia with clots, alcoholism, and anxiety who presented to ED with irregular vaginal bleeding and a symptomatic anemia.  She was admitted with acute blood loss anemia related to menorrhagia and has undergone PRBC transfusion with improvement in hemoglobin and hematocrit levels noted this morning.  She is otherwise passing less blood clots and has less bleeding since being started on Lysteda and IV Premarin.  She is complaining of some anxiety this morning and states that she was recently prescribed Xanax to try for anxiety management as she tends to self medicate at home with alcohol.  She is also complaining of lower extremity cramping that has started this morning.  She is noted to have some hypokalemia which is undergoing both oral and IV replacement.  Assessment & Plan:   Active Problems:   Symptomatic anemia  1. Acute symptomatic blood loss anemia secondary to menorrhagia-stable.  Per primary management with ongoing listed in Premarin.  Continue monitor repeat CBC in a.m. which appears to be improved.  Menorrhagia seems to be slowing down. 2. Hypokalemia/hypomagnesemia.    Hypokalemia has resolved and will recheck labs in a.m.  Hypomagnesemia continues to persist and will supplement once again with IV magnesium sulfate and recheck in a.m.  This may be causing some of her lower extremity cramping at this time, but again this is improving.  3. Alcohol abuse.  Encouraged cessation of alcohol and restarted Xanax 3 times daily as needed for anxiety which hopefully will allow patient to wean off alcohol use.  Additionally placed on CIWA protocol and will monitor closely. 4. Elevated transaminases-improving.  Suspect secondary to above.  Continue monitor with CMP in a.m.  This is  downtrending, however and is a good sign even in the face of some nausea and vomiting. 5. N/V/D.  We will continue to monitor and maintain on gentle IV fluid with half-normal saline at this time.  Soft diet.  Patient does have some mild abdominal tenderness, but this is chronic with no new changes at this time.   DVT prophylaxis: SCDs Code Status: Full Family Communication: None at bedside Disposition Plan: Continue current management with further magnesium supplementation and some IV fluid until diet stabilizes.  Continue to monitor for nausea and vomiting and diarrhea.   Procedures:   None  Antimicrobials:   None   Subjective: Patient seen and evaluated today with no new acute complaints or concerns. No acute concerns or events noted overnight.  She is drowsy this morning and states that her lower extremity cramping has improved some.  She has developed some nausea and vomiting as well as mild diarrhea overnight.  She states that her vaginal bleeding has slowed down significantly.  Objective: Vitals:   11/02/18 0841 11/02/18 1406 11/03/18 0007 11/03/18 0555  BP: 133/90 130/81 134/75 115/72  Pulse: 75 75 88 80  Resp: 19 18 20 20   Temp: 98.6 F (37 C) 98.5 F (36.9 C) 99.4 F (37.4 C) 98.4 F (36.9 C)  TempSrc: Oral Oral Oral Oral  SpO2: 99% 99% 98% 100%    Intake/Output Summary (Last 24 hours) at 11/03/2018 0850 Last data filed at 11/02/2018 1700 Gross per 24 hour  Intake 2112.82 ml  Output -  Net 2112.82 ml   There were no vitals filed for this visit.  Examination:  General exam: Appears calm and comfortable  Respiratory system: Clear to auscultation. Respiratory effort normal. Cardiovascular system: S1 & S2 heard, RRR. No JVD, murmurs, rubs, gallops or clicks. No pedal edema. Gastrointestinal system: Abdomen is nondistended, soft and nontender. No organomegaly or masses felt. Normal bowel sounds heard. Central nervous system: Alert and oriented. No focal  neurological deficits. Extremities: Symmetric 5 x 5 power. Skin: No rashes, lesions or ulcers Psychiatry: Judgement and insight appear normal. Mood & affect appropriate.     Data Reviewed: I have personally reviewed following labs and imaging studies  CBC: Recent Labs  Lab 10/28/18 1425 11/01/18 1625 11/02/18 0450 11/03/18 0436  WBC 6.3 4.3  --  7.8  NEUTROABS 4.9  --   --   --   HGB 7.8* 5.2* 8.3* 8.0*  HCT 27.7* 18.7* 27.2* 27.1*  MCV 71.6* 71.4*  --  76.3*  PLT 125* 138*  --  381   Basic Metabolic Panel: Recent Labs  Lab 10/28/18 1425 10/28/18 1618 11/01/18 1625 11/02/18 0450 11/03/18 0436  NA 134*  --  133* 132* 135  K 2.8*  --  2.7* 3.3* 4.3  CL 95*  --  96* 100 101  CO2 26  --  25 25 25   GLUCOSE 105*  --  103* 94 79  BUN 10  --  10 13 9   CREATININE 0.68  --  0.56 0.61 0.62  CALCIUM 8.8*  --  8.4* 8.2* 8.5*  MG  --  1.2*  --  1.2* 1.4*   GFR: Estimated Creatinine Clearance: 97.3 mL/min (by C-G formula based on SCr of 0.62 mg/dL). Liver Function Tests: Recent Labs  Lab 10/28/18 1618 11/01/18 1625 11/02/18 0450 11/03/18 0436  AST 145* 133* 98* 68*  ALT 74* 67* 57* 40  ALKPHOS 99 82 79 70  BILITOT 1.2 0.7 1.4* 1.3*  PROT 8.3* 7.4 7.1 6.6  ALBUMIN 4.2 3.7 3.7 3.4*   Recent Labs  Lab 10/28/18 1425  LIPASE 34   Recent Labs  Lab 10/28/18 1619  AMMONIA 23   Coagulation Profile: Recent Labs  Lab 10/28/18 1647 11/01/18 1625  INR 1.1 1.1   Cardiac Enzymes: Recent Labs  Lab 10/28/18 1425  TROPONINI <0.03   BNP (last 3 results) No results for input(s): PROBNP in the last 8760 hours. HbA1C: No results for input(s): HGBA1C in the last 72 hours. CBG: No results for input(s): GLUCAP in the last 168 hours. Lipid Profile: No results for input(s): CHOL, HDL, LDLCALC, TRIG, CHOLHDL, LDLDIRECT in the last 72 hours. Thyroid Function Tests: No results for input(s): TSH, T4TOTAL, FREET4, T3FREE, THYROIDAB in the last 72 hours. Anemia Panel: No  results for input(s): VITAMINB12, FOLATE, FERRITIN, TIBC, IRON, RETICCTPCT in the last 72 hours. Sepsis Labs: No results for input(s): PROCALCITON, LATICACIDVEN in the last 168 hours.  No results found for this or any previous visit (from the past 240 hour(s)).       Radiology Studies: No results found.      Scheduled Meds: . conjugated estrogens  25 mg Intravenous W2X  . folic acid  1 mg Oral Daily  . leuprolide  3.75 mg Intramuscular Once  . multivitamin with minerals  1 tablet Oral Daily  . thiamine  100 mg Oral Daily   Or  . thiamine  100 mg Intravenous Daily  . tranexamic acid  1,300 mg Oral TID   Continuous Infusions: . sodium chloride    . magnesium sulfate 1 - 4 g bolus IVPB  LOS: 1 day    Time spent: 30 minutes    Petr Bontempo Darleen Crocker, DO [facility] Pager [phone]  If 7PM-7AM, please contact night-coverage [email or web address] Password [facility] 11/03/2018, 8:50 AM
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
## 2018-11-03 NOTE — Discharge Instructions (Addendum)
Alcohol Use Disorder °Alcohol use disorder is when your drinking disrupts your daily life. When you have this condition, you drink too much alcohol and you cannot control your drinking. °Alcohol use disorder can cause serious problems with your physical health. It can affect your brain, heart, liver, pancreas, immune system, stomach, and intestines. Alcohol use disorder can increase your risk for certain cancers and cause problems with your mental health, such as depression, anxiety, psychosis, delirium, and dementia. People with this disorder risk hurting themselves and others. °What are the causes? °This condition is caused by drinking too much alcohol over time. It is not caused by drinking too much alcohol only one or two times. Some people with this condition drink alcohol to cope with or escape from negative life events. Others drink to relieve pain or symptoms of mental illness. °What increases the risk? °You are more likely to develop this condition if: °· You have a family history of alcohol use disorder. °· Your culture encourages drinking to the point of intoxication, or makes alcohol easy to get. °· You had a mood or conduct disorder in childhood. °· You have been a victim of abuse. °· You are an adolescent and: °? You have poor grades or difficulties in school. °? Your caregivers do not talk to you about saying no to alcohol, or supervise your activities. °? You are impulsive or you have trouble with self-control. °What are the signs or symptoms? °Symptoms of this condition include: °· Drinking more than you want to. °· Drinking for longer than you want to. °· Trying several times to drink less or to control your drinking. °· Spending a lot of time getting alcohol, drinking, or recovering from drinking. °· Craving alcohol. °· Having problems at work, at school, or at home due to drinking. °· Having problems in relationships due to drinking. °· Drinking when it is dangerous to drink, such as before  driving a car. °· Continuing to drink even though you know you might have a physical or mental problem related to drinking. °· Needing more and more alcohol to get the same effect you want from the alcohol (building up tolerance). °· Having symptoms of withdrawal when you stop drinking. Symptoms of withdrawal include: °? Fatigue. °? Nightmares. °? Trouble sleeping. °? Depression. °? Anxiety. °? Fever. °? Seizures. °? Severe confusion. °? Feeling or seeing things that are not there (hallucinations). °? Tremors. °? Rapid heart rate. °? Rapid breathing. °? High blood pressure. °· Drinking to avoid symptoms of withdrawal. °How is this diagnosed? °This condition is diagnosed with an assessment. Your health care provider may start the assessment by asking three or four questions about your drinking. °Your health care provider may perform a physical exam or do lab tests to see if you have physical problems resulting from alcohol use. She or he may refer you to a mental health professional for evaluation. °How is this treated? °Some people with alcohol use disorder are able to reduce their alcohol use to low-risk levels. Others need to completely quit drinking alcohol. When necessary, mental health professionals with specialized training in substance use treatment can help. Your health care provider can help you decide how severe your alcohol use disorder is and what type of treatment you need. The following forms of treatment are available: °· Detoxification. Detoxification involves quitting drinking and using prescription medicines within the first week to help lessen withdrawal symptoms. This treatment is important for people who have had withdrawal symptoms before and for heavy drinkers   who are likely to have withdrawal symptoms. Alcohol withdrawal can be dangerous, and in severe cases, it can cause death. Detoxification may be provided in a home, community, or primary care setting, or in a hospital or substance use  treatment facility. °· Counseling. This treatment is also called talk therapy. It is provided by substance use treatment counselors. A counselor can address the reasons you use alcohol and suggest ways to keep you from drinking again or to prevent problem drinking. The goals of talk therapy are to: °? Find healthy activities and ways for you to cope with stress. °? Identify and avoid the things that trigger your alcohol use. °? Help you learn how to handle cravings. °· Medicines. Medicines can help treat alcohol use disorder by: °? Decreasing alcohol cravings. °? Decreasing the positive feeling you have when you drink alcohol. °? Causing an uncomfortable physical reaction when you drink alcohol (aversion therapy). °· Support groups. Support groups are led by people who have quit drinking. They provide emotional support, advice, and guidance. °These forms of treatment are often combined. Some people with this condition benefit from a combination of treatments provided by specialized substance use treatment centers. °Follow these instructions at home: °· Take over-the-counter and prescription medicines only as told by your health care provider. °· Check with your health care provider before starting any new medicines. °· Ask friends and family members not to offer you alcohol. °· Avoid situations where alcohol is served, including gatherings where others are drinking alcohol. °· Create a plan for what to do when you are tempted to use alcohol. °· Find hobbies or activities that you enjoy that do not include alcohol. °· Keep all follow-up visits as told by your health care provider. This is important. °How is this prevented? °· If you drink, limit alcohol intake to no more than 1 drink a day for nonpregnant women and 2 drinks a day for men. One drink equals 12 oz of beer, 5 oz of wine, or 1½ oz of hard liquor. °· If you have a mental health condition, get treatment and support. °· Do not give alcohol to  adolescents. °· If you are an adolescent: °? Do not drink alcohol. °? Do not be afraid to say no if someone offers you alcohol. Speak up about why you do not want to drink. You can be a positive role model for your friends and set a good example for those around you by not drinking alcohol. °? If your friends drink, spend time with others who do not drink alcohol. Make new friends who do not use alcohol. °? Find healthy ways to manage stress and emotions, such as meditation or deep breathing, exercise, spending time in nature, listening to music, or talking with a trusted friend or family member. °Contact a health care provider if: °· You are not able to take your medicines as told. °· Your symptoms get worse. °· You return to drinking alcohol (relapse) and your symptoms get worse. °Get help right away if: °· You have thoughts about hurting yourself or others. °If you ever feel like you may hurt yourself or others, or have thoughts about taking your own life, get help right away. You can go to your nearest emergency department or call: °· Your local emergency services (911 in the [place] °· A suicide crisis helpline, such as the National Suicide Prevention Lifeline at 1-800-273-8255. This is open 24 hours a day. °Summary °· Alcohol use disorder is when your drinking disrupts your daily   life. When you have this condition, you drink too much alcohol and you cannot control your drinking.  Treatment may include detoxification, counseling, medicine, and support groups.  Ask friends and family members not to offer you alcohol. Avoid situations where alcohol is served.  Get help right away if you have thoughts about hurting yourself or others. This information is not intended to replace advice given to you by your health care provider. Make sure you discuss any questions you have with your health care provider. Document Released: 09/03/2004 Document Revised: 04/23/2016 Document Reviewed: 04/23/2016 Elsevier  Interactive Patient Education  2019 [employer]. Alcohol Abuse and Nutrition Alcohol abuse is any pattern of alcohol consumption that harms your health, relationships, or work. Alcohol abuse can cause poor nutrition (malnutrition or malnourishment) and a lack of nutrients (nutrient deficiencies), which can lead to more complications. Alcohol abuse brings malnutrition and nutrient deficiencies in two ways:  It causes your liver to work abnormally. This affects how your body divides (breaks down) and absorbs nutrients from food.  It causes you to eat poorly. Many people who abuse alcohol do not eat enough carbohydrates, protein, fat, vitamins, and minerals. Nutrients that are commonly lacking (deficient) in people who abuse alcohol include:  Vitamins. ? Vitamin A. This is needed for your vision, metabolism, and ability to fight off infections (immunity). ? B vitamins. These include folate, thiamine, and niacin. These are needed for new cell growth. ? Vitamin C. This plays an important role in wound healing, immunity, and helping your body to absorb iron. ? Vitamin D. This is necessary for your body to absorb and use calcium. It is produced by your liver, but you can also get it from food and from sun exposure.  Minerals. ? Calcium. This is needed for healthy bones as well as heart and blood vessel (cardiovascular) function. ? Iron. This is important for blood, muscle, and nervous system functioning. ? Magnesium. This plays an important role in muscle and nerve function, and it helps to control blood sugar and blood pressure. ? Zinc. This is important for the normal functioning of your nervous system and digestive system (gastrointestinal tract). If you think that you have an alcohol dependency problem, or if it is hard to stop drinking because you feel sick or different when you do not use alcohol, talk with your health care provider or another health professional about where to get  help. Nutrition is an essential factor in therapy for alcohol abuse. Your health care provider or diet and nutrition specialist (dietitian) will work with you to design a plan that can help to restore nutrients to your body and prevent the risk of complications. What is my plan? Your dietitian may develop a specific eating plan that is based on your condition and any other problems that you have. An eating plan will commonly include:  A balanced diet. ? Grains: 6-8 oz (170-227 g) a day. Examples of 1 oz of whole grains include 1 cup of whole-wheat cereal,  cup of brown rice, or 1 slice of whole-wheat bread. ? Vegetables: 2-3 cups a day. Examples of 1 cup of vegetables include 2 medium carrots, 1 large tomato, or 2 stalks of celery. ? Fruits: 1-2 cups a day. Examples of 1 cup of fruit include 1 large banana, 1 small apple, 8 large strawberries, or 1 large orange. ? Meat and other protein: 5-6 oz (142-170 g) a day.  A cut of meat or fish that is the size of a deck of  cards is about 3-4 oz.  Foods that provide 1 oz of protein include 1 egg,  cup of nuts or seeds, or 1 tablespoon (16 g) of peanut butter. ? Dairy: 2-3 cups a day. Examples of 1 cup of dairy include 8 oz (230 mL) of milk, 8 oz (230 g) of yogurt, or 1 oz (44 g) of natural cheese.  Vitamin and mineral supplements. What are tips for following this plan?  Eat frequent meals and snacks. Try to eat 5-6 small meals each day.  Take vitamin or mineral supplements as recommended by your dietitian.  If you are malnourished or if your dietitian recommends it: ? You may follow a high-protein, high-calorie diet. This may include:  2,000-3,000 calories (kilocalories) a day.  70-100 g (grams) of protein a day. ? You may be directed to follow a diet that includes a complete nutritional supplement beverage. This can help to restore calories, protein, and vitamins to your body. Depending on your condition, you may be advised to consume this  beverage instead of your meals or in addition to them.  Certain medicines may cause changes in your appetite, taste, and weight. Work with your health care provider and dietitian to make any changes to your medicines and eating plan.  If you are unable to take in enough food and calories by mouth, your health care provider may recommend a feeding tube. This tube delivers nutritional supplements directly to your stomach. Recommended foods  Eat foods that are high in molecules that prevent oxygen from reacting with your food (antioxidants). These foods include grapes, berries, nuts, green tea, and dark green or orange vegetables. Eating these can help to prevent some of the stress that is placed on your liver by consuming alcohol.  Eat a variety of fresh fruits and vegetables each day. This will help you to get fiber and vitamins in your diet.  Drink plenty of water and other clear fluids, such as apple juice and broth. Try to drink at least 48-64 oz (1.5-2 L) of water a day.  Include foods fortified with vitamins and minerals in your diet. Commonly fortified foods include milk, orange juice, cereal, and bread.  Eat a variety of foods that are high in omega-3 and omega-6 fatty acids. These include fish, nuts and seeds, and soybeans. These foods may help your liver to recover and may also stabilize your mood.  If you are a vegetarian: ? Eat a variety of protein-rich foods. ? Pair whole grains with plant-based proteins at meals and snack time. For example, eat rice with beans, put peanut butter on whole-grain toast, or eat oatmeal with sunflower seeds. The items listed above may not be a complete list of foods and beverages you can eat. Contact a dietitian for more information. Foods to avoid  Avoid foods and drinks that are high in fat and sugar. Sugary drinks, salty snacks, and candy contain empty calories. This means that they lack important nutrients such as protein, fiber, and  vitamins.  Avoid alcohol. This is the best way to avoid malnutrition due to alcohol abuse. If you must drink, drink measured amounts. Measured drinking means limiting your intake to no more than 1 drink a day for nonpregnant women and 2 drinks a day for men. One drink equals 12 oz (355 mL) of beer, 5 oz (148 mL) of wine, or 1 oz (44 mL) of hard liquor.  Limit your intake of caffeine. Replace drinks like coffee and black tea with decaffeinated coffee and decaffeinated  herbal tea. The items listed above may not be a complete list of foods and beverages you should avoid. Contact a dietitian for more information. Summary  Alcohol abuse can cause poor nutrition (malnutrition or malnourishment) and a lack of nutrients (nutrient deficiencies), which can lead to more health problems.  Common nutrient deficiencies include vitamin deficiencies (A, B, C, and D) and mineral deficiencies (calcium, iron, magnesium, and zinc).  Nutrition is an essential factor in therapy for alcohol abuse.  Your health care provider and dietitian can help you to develop a specific eating plan that includes a balanced diet plus vitamin and mineral supplements. This information is not intended to replace advice given to you by your health care provider. Make sure you discuss any questions you have with your health care provider. Document Released: 05/21/2005 Document Revised: 03/30/2018 Document Reviewed: 04/13/2017 Elsevier Interactive Patient Education  2019 [employer] Take multivitamins and iron tablets daily.  You may need stool softener as iron can cause constipation.  Take the Megace tablets to control the bleeding.  You may take them 3 times daily until bleeding stops and then once daily until you are seen in the follow-up visit.  Please notify our office if bleeding vaginally becomes heavy.  Small doses of Xanax have been written to assist you with withdrawal if you experience nervousness and anxiety as you stay away  from alcohol and avoid its use.

## 2018-11-03 NOTE — Progress Notes (Signed)
Subjective: Hospital day 3 for acute on chronic anemia due to menorrhagia, uterine fibroids, , also hypokalemia, hypomagnesemia. Patient reports nausea, tolerating PO, + flatus and no problems voiding.    Objective: I have reviewed patient's vital signs, medications and labs.  General: alert, cooperative and no distress GI: soft, non-tender; bowel sounds normal; no masses,  no organomegaly Extremities: extremities normal, atraumatic, no cyanosis or edema and Homans sign is negative, no sign of DVT Vaginal Bleeding: minimal vaginal packing removed last pm. CBC Latest Ref Rng & Units 11/03/2018 11/02/2018 11/01/2018  WBC 4.0 - 10.5 K/uL 7.8 - 4.3  Hemoglobin 12.0 - 15.0 g/dL 8.0(L) 8.3(L) 5.2(LL)  Hematocrit 36.0 - 46.0 % 27.1(L) 27.2(L) 18.7(L)  Platelets 150 - 400 K/uL 178 - 138(L)   CMP Latest Ref Rng & Units 11/03/2018 11/02/2018 11/01/2018  Glucose 70 - 99 mg/dL 79 94 103(H)  BUN 6 - 20 mg/dL 9 13 10   Creatinine 0.44 - 1.00 mg/dL 0.62 0.61 0.56  Sodium 135 - 145 mmol/L 135 132(L) 133(L)  Potassium 3.5 - 5.1 mmol/L 4.3 3.3(L) 2.7(LL)  Chloride 98 - 111 mmol/L 101 100 96(L)  CO2 22 - 32 mmol/L 25 25 25   Calcium 8.9 - 10.3 mg/dL 8.5(L) 8.2(L) 8.4(L)  Total Protein 6.5 - 8.1 g/dL 6.6 7.1 7.4  Total Bilirubin 0.3 - 1.2 mg/dL 1.3(H) 1.4(H) 0.7  Alkaline Phos 38 - 126 U/L 70 79 82  AST 15 - 41 U/L 68(H) 98(H) 133(H)  ALT 0 - 44 U/L 40 57(H) 67(H)  magnesium sulfate to be infused this a.m  n  Assessment/Plan: Acute on chronic anemia Menorrhagia controlled Hypokalemia corrected Alcohol use, currently no withdrawal. Hypomagnesemia. Being corrected Plan:  Given Depo Provera shot this a.m. Will give injectafer as outpatient at followup, send home on po iron for now, multivitamin with folate. D/c by midday  LOS: 1 day    [name] 11/03/2018, 9:18 AM

## 2018-11-06 ENCOUNTER — Telehealth: Payer: Self-pay | Admitting: Obstetrics and Gynecology

## 2018-11-06 NOTE — Telephone Encounter (Signed)
Pathology benign, unable to contact pt by phone.

## 2018-12-20 ENCOUNTER — Telehealth: Payer: Self-pay | Admitting: Obstetrics and Gynecology

## 2018-12-20 ENCOUNTER — Telehealth: Payer: Self-pay | Admitting: *Deleted

## 2018-12-20 DIAGNOSIS — N921 Excessive and frequent menstruation with irregular cycle: Secondary | ICD-10-CM

## 2018-12-20 DIAGNOSIS — D508 Other iron deficiency anemias: Secondary | ICD-10-CM

## 2018-12-20 MED ORDER — LEUPROLIDE ACETATE 3.75 MG IM KIT: 3.7500 mg | PACK | Freq: Once | INTRAMUSCULAR | 0 refills | Status: AC

## 2018-12-20 NOTE — Telephone Encounter (Signed)
Unable to leave voicemail.

## 2018-12-20 NOTE — Telephone Encounter (Signed)
Patient called stating that she is still bleeding and is ready to schedule and appointment for her surgery. Please contact pt

## 2018-12-20 NOTE — Telephone Encounter (Signed)
Pt to get CBC, and anemia panel and Lupron is ordered, pt taking vits with iron.  Biopsy benign, pt may need hysteroscopy D& c EAblation, though she would be okay with hyst.  Preop eval once lupron given and response identified.

## 2018-12-20 NOTE — Telephone Encounter (Signed)
UNABLE TO reach pt at home number or mobile number. Unable to leave message. Mailbox is full.

## 2018-12-21 ENCOUNTER — Telehealth: Payer: Self-pay | Admitting: Obstetrics & Gynecology

## 2018-12-21 NOTE — Telephone Encounter (Signed)
[facility] [phone] called and stated that they transferred the patient's script for Lupron to [facility] and it needs prior authorization [id number].

## 2018-12-22 ENCOUNTER — Telehealth: Payer: Self-pay | Admitting: *Deleted

## 2018-12-22 NOTE — Telephone Encounter (Signed)
Mailbox full

## 2018-12-26 NOTE — Telephone Encounter (Signed)
No answer @ 10:50 am. JSY

## 2018-12-26 NOTE — Telephone Encounter (Signed)
No answer @ 4:53 pm. JSY

## 2018-12-26 NOTE — Telephone Encounter (Signed)
Pt returning call to nurse

## 2018-12-27 ENCOUNTER — Telehealth: Payer: Self-pay | Admitting: Obstetrics and Gynecology

## 2018-12-27 NOTE — Telephone Encounter (Signed)
No answer @ 4:50 pm. JSY

## 2018-12-27 NOTE — Telephone Encounter (Signed)
Patient called stating that she would like a call back from the nurse. Pt states that she was suppose to do some labs and get a shot. Pt states that she has been bleeding for 16 days. Please contact pt

## 2018-12-28 ENCOUNTER — Telehealth: Payer: Self-pay | Admitting: Obstetrics and Gynecology

## 2018-12-28 ENCOUNTER — Telehealth: Payer: Self-pay | Admitting: *Deleted

## 2018-12-28 ENCOUNTER — Other Ambulatory Visit: Payer: Self-pay

## 2018-12-28 ENCOUNTER — Ambulatory Visit: Payer: 59 | Admitting: Gastroenterology

## 2018-12-28 NOTE — Telephone Encounter (Signed)
Please call pt at [phone]

## 2018-12-28 NOTE — Telephone Encounter (Signed)
Spoke with pt. Pt plans on having labs tomorrow. Order is in the system. I called pharmacy, [phone] asking about Lupron injection and she is unsure right now if it will require a PA. Will let us know if it does. [place]

## 2018-12-28 NOTE — Telephone Encounter (Signed)
Spoke with pt. Pt plans on having labs checked tomorrow. Waiting to see if Lupron injection will need PA. Pharmacy will let us know if it does. [place]

## 2018-12-28 NOTE — Telephone Encounter (Signed)
Call can't be completed @ 10:09 am. JSY

## 2018-12-28 NOTE — Telephone Encounter (Signed)
Called patient for virtual visit with AB. Received message the wireless customer is currently not available. Unable to leave message and patient did not call in either.

## 2018-12-28 NOTE — Telephone Encounter (Signed)
Pt called back said she had not heard from our office, she had left a message , I reviewed the chart and advised pt that Marcie Bal had tried to call the patient 2 times once yesterday afternoon and again this am.  I called back to see if Marcie Bal was able to talk with the patient. And she was not / somehow I lost the call and when I tried to call back I was unable to get pt and unable to leave a message.

## 2018-12-29 ENCOUNTER — Encounter: Payer: Self-pay | Admitting: Gastroenterology

## 2018-12-29 ENCOUNTER — Telehealth: Payer: Self-pay | Admitting: Gastroenterology

## 2018-12-29 NOTE — Telephone Encounter (Signed)
PATIENT WAS A NO SHOW/NO ANSWER AND LETTER SENT  °

## 2018-12-29 NOTE — Telephone Encounter (Signed)
NO SHOWED AND LETTER SENT  °

## 2019-01-04 ENCOUNTER — Telehealth: Payer: Self-pay | Admitting: Obstetrics and Gynecology

## 2019-01-04 NOTE — Telephone Encounter (Signed)
Called patient back, no answer. If she calls back let her talk to a nurse so we can determine what she needs. She should not need rx sent in again.

## 2019-01-04 NOTE — Telephone Encounter (Signed)
Pt states she is needing the injection sent in for her to help her stop bleeding. She could not remember the name of the medicaiton.

## 2019-01-05 ENCOUNTER — Telehealth: Payer: Self-pay | Admitting: Obstetrics and Gynecology

## 2019-01-05 DIAGNOSIS — D508 Other iron deficiency anemias: Secondary | ICD-10-CM

## 2019-01-05 LAB — FOLATE: Folate: 6 ng/mL (ref >3.0)

## 2019-01-05 LAB — FERRITIN: Ferritin: 21 ng/mL (ref 15–150)

## 2019-01-05 LAB — VITAMIN B12: Vitamin B-12: 749 pg/mL (ref 232–1245)

## 2019-01-05 LAB — IRON AND TIBC
Iron Saturation: 3 % — CL (ref 15–55)
Iron: 13 ug/dL — ABNORMAL LOW (ref 27–159)
Total Iron Binding Capacity: 430 ug/dL (ref 250–450)
UIBC: 417 ug/dL (ref 131–425)

## 2019-01-05 NOTE — Telephone Encounter (Signed)
Attempt to reach the patient, who has a full mailbox.

## 2019-01-05 NOTE — Telephone Encounter (Signed)
Patient called stating that she missed a phone call from the office regarding her blood work. Please contact patient

## 2019-01-05 NOTE — Telephone Encounter (Signed)
Pt did not get CBC with yesterdays anemia panel. Will come back in for CBC. Pt to improve her taking of iron. Lab slip printed out today for pt to pick up.

## 2019-01-10 NOTE — Telephone Encounter (Signed)
Tish, RN to review and see if she has received a PA on this pt. Encounter closed. [place]

## 2019-01-11 ENCOUNTER — Other Ambulatory Visit: Payer: Self-pay

## 2019-01-11 ENCOUNTER — Emergency Department (HOSPITAL_COMMUNITY): Payer: Medicaid Other

## 2019-01-11 ENCOUNTER — Encounter (HOSPITAL_COMMUNITY): Payer: Self-pay | Admitting: Emergency Medicine

## 2019-01-11 ENCOUNTER — Emergency Department (HOSPITAL_COMMUNITY)
Admission: EM | Admit: 2019-01-11 | Discharge: 2019-01-11 | Disposition: A | Discharge status: Home / Self Care | Payer: Medicaid Other | Attending: Emergency Medicine | Admitting: Emergency Medicine

## 2019-01-11 ENCOUNTER — Telehealth: Payer: Self-pay | Admitting: Obstetrics and Gynecology

## 2019-01-11 DIAGNOSIS — E876 Hypokalemia: Secondary | ICD-10-CM | POA: Insufficient documentation

## 2019-01-11 DIAGNOSIS — R748 Abnormal levels of other serum enzymes: Secondary | ICD-10-CM | POA: Diagnosis not present

## 2019-01-11 DIAGNOSIS — R531 Weakness: Secondary | ICD-10-CM | POA: Diagnosis present

## 2019-01-11 DIAGNOSIS — Z79899 Other long term (current) drug therapy: Secondary | ICD-10-CM | POA: Insufficient documentation

## 2019-01-11 DIAGNOSIS — F1010 Alcohol abuse, uncomplicated: Secondary | ICD-10-CM

## 2019-01-11 LAB — BASIC METABOLIC PANEL
Anion gap: 18 — ABNORMAL HIGH (ref 5–15)
BUN: 9 mg/dL (ref 6–20)
CO2: 23 mmol/L (ref 22–32)
Calcium: 9 mg/dL (ref 8.9–10.3)
Chloride: 100 mmol/L (ref 98–111)
Creatinine, Ser: 0.55 mg/dL (ref 0.44–1.00)
GFR calc Af Amer: >60 mL/min (ref >60)
GFR calc non Af Amer: >60 mL/min (ref >60)
Glucose, Bld: 96 mg/dL (ref 70–99)
Potassium: 2.9 mmol/L — ABNORMAL LOW (ref 3.5–5.1)
Sodium: 141 mmol/L (ref 135–145)

## 2019-01-11 LAB — HEPATIC FUNCTION PANEL
ALT: 150 U/L — ABNORMAL HIGH (ref 0–44)
AST: 275 U/L — ABNORMAL HIGH (ref 15–41)
Albumin: 4.2 g/dL (ref 3.5–5.0)
Alkaline Phosphatase: 94 U/L (ref 38–126)
Bilirubin, Direct: 0.7 mg/dL — ABNORMAL HIGH (ref 0.0–0.2)
Indirect Bilirubin: 0.8 mg/dL (ref 0.3–0.9)
Total Bilirubin: 1.5 mg/dL — ABNORMAL HIGH (ref 0.3–1.2)
Total Protein: 8.4 g/dL — ABNORMAL HIGH (ref 6.5–8.1)

## 2019-01-11 LAB — RETICULOCYTES
Immature Retic Fract: 22.2 % — ABNORMAL HIGH (ref 2.3–15.9)
RBC.: 4.42 MIL/uL (ref 3.87–5.11)
Retic Count, Absolute: 98.1 10*3/uL (ref 19.0–186.0)
Retic Ct Pct: 2.2 % (ref 0.4–3.1)

## 2019-01-11 LAB — VITAMIN B12: Vitamin B-12: 562 pg/mL (ref 180–914)

## 2019-01-11 LAB — CBC
HCT: 31.2 % — ABNORMAL LOW (ref 36.0–46.0)
Hemoglobin: 8.4 g/dL — ABNORMAL LOW (ref 12.0–15.0)
MCH: 18.8 pg — ABNORMAL LOW (ref 26.0–34.0)
MCHC: 26.9 g/dL — ABNORMAL LOW (ref 30.0–36.0)
MCV: 70 fL — ABNORMAL LOW (ref 80.0–100.0)
Platelets: 139 10*3/uL — ABNORMAL LOW (ref 150–400)
RBC: 4.46 MIL/uL (ref 3.87–5.11)
RDW: 18.5 % — ABNORMAL HIGH (ref 11.5–15.5)
WBC: 2.9 10*3/uL — ABNORMAL LOW (ref 4.0–10.5)
nRBC: 0 % (ref 0.0–0.2)

## 2019-01-11 LAB — FERRITIN: Ferritin: 16 ng/mL (ref 11–307)

## 2019-01-11 LAB — ETHANOL: Alcohol, Ethyl (B): 184 mg/dL — ABNORMAL HIGH (ref <10)

## 2019-01-11 LAB — CBG MONITORING, ED: Glucose-Capillary: 94 mg/dL (ref 70–99)

## 2019-01-11 LAB — ACETAMINOPHEN LEVEL: Acetaminophen (Tylenol), Serum: <10 ug/mL — ABNORMAL LOW (ref 10–30)

## 2019-01-11 LAB — TROPONIN I: Troponin I: <0.03 ng/mL (ref <0.03)

## 2019-01-11 LAB — IRON AND TIBC
Iron: 7 ug/dL — ABNORMAL LOW (ref 28–170)
Saturation Ratios: 1 % — ABNORMAL LOW (ref 10.4–31.8)
TIBC: 499 ug/dL — ABNORMAL HIGH (ref 250–450)
UIBC: 492 ug/dL

## 2019-01-11 LAB — I-STAT BETA HCG BLOOD, ED (MC, WL, AP ONLY): I-stat hCG, quantitative: <5 m[IU]/mL (ref <5)

## 2019-01-11 LAB — TYPE AND SCREEN
ABO/RH(D): A POS
Antibody Screen: NEGATIVE

## 2019-01-11 LAB — MAGNESIUM: Magnesium: 1.3 mg/dL — ABNORMAL LOW (ref 1.7–2.4)

## 2019-01-11 LAB — D-DIMER, QUANTITATIVE: D-Dimer, Quant: 0.31 ug/mL-FEU (ref 0.00–0.50)

## 2019-01-11 MED ORDER — POTASSIUM CHLORIDE CRYS ER 20 MEQ PO TBCR: 20.0000 meq | EXTENDED_RELEASE_TABLET | Freq: Two times a day (BID) | ORAL | 0 refills | Status: DC

## 2019-01-11 MED ORDER — ONDANSETRON HCL 4 MG/2ML IJ SOLN
4.0000 mg | Freq: Once | INTRAMUSCULAR | Status: AC
  Administered 2019-01-11: 4 mg via INTRAVENOUS
  Filled 2019-01-11: qty 2

## 2019-01-11 MED ORDER — POTASSIUM CHLORIDE 10 MEQ/100ML IV SOLN
10.0000 meq | Freq: Once | INTRAVENOUS | Status: AC
  Administered 2019-01-11: 10 meq via INTRAVENOUS
  Filled 2019-01-11: qty 100

## 2019-01-11 MED ORDER — CHLORDIAZEPOXIDE HCL 25 MG PO CAPS: ORAL_CAPSULE | ORAL | 0 refills | Status: DC

## 2019-01-11 MED ORDER — FERROUS SULFATE 325 (65 FE) MG PO TABS: 325.0000 mg | ORAL_TABLET | Freq: Every day | ORAL | 0 refills | Status: DC

## 2019-01-11 MED ORDER — LORAZEPAM 2 MG/ML IJ SOLN
0.5000 mg | Freq: Once | INTRAMUSCULAR | Status: AC
  Administered 2019-01-11: 0.5 mg via INTRAVENOUS
  Filled 2019-01-11: qty 1

## 2019-01-11 MED ORDER — POTASSIUM CHLORIDE 20 MEQ PO PACK
40.0000 meq | PACK | Freq: Once | ORAL | Status: AC
  Administered 2019-01-11: 13:00:00 40 meq via ORAL
  Filled 2019-01-11: qty 2

## 2019-01-11 MED ORDER — MAGNESIUM SULFATE 2 GM/50ML IV SOLN
2.0000 g | Freq: Once | INTRAVENOUS | Status: AC
  Administered 2019-01-11: 2 g via INTRAVENOUS
  Filled 2019-01-11: qty 50

## 2019-01-11 NOTE — Telephone Encounter (Signed)
Pt came by our office to speak with Dr Glo Herring. He is not in the office this week . Pt thinks she needs a blood transfusion. I spoke with Estill Bamberg and advised that she needed to go to the er . Pt okay.

## 2019-01-11 NOTE — ED Notes (Signed)
Patient transported to X-ray 

## 2019-01-11 NOTE — ED Notes (Signed)
Patient transported to Ultrasound 

## 2019-01-11 NOTE — Discharge Instructions (Signed)
As discussed, it's important that you call Dr. Olevia Perches office to arrange a follow-up appt.  Regarding your liver studies.  Try to stop drinking alcohol.  You can contact one of the referral clinics for the alcohol .  Return to ER for any worsening symptoms

## 2019-01-11 NOTE — ED Triage Notes (Signed)
Pt c/o of weakness x 3 days. Pt states " I feel like my blood is off and I have anemia"

## 2019-01-11 NOTE — ED Notes (Signed)
Advised patient we needed urine specimen.  Patient has some nausea and vomiting at this time.  Nurse advised.

## 2019-01-11 NOTE — ED Notes (Signed)
Pt reveals that she is a daily drinker, "Rain with ginger" at least 3-4 times daily.  Last drank at 2300 last night.  Pt has visible tremors and has had one episode of vomiting this shift.
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
## 2019-01-12 ENCOUNTER — Telehealth: Payer: Self-pay | Admitting: *Deleted

## 2019-01-12 ENCOUNTER — Other Ambulatory Visit: Payer: Self-pay | Admitting: *Deleted

## 2019-01-12 DIAGNOSIS — N920 Excessive and frequent menstruation with regular cycle: Secondary | ICD-10-CM

## 2019-01-12 LAB — HEPATITIS B E ANTIGEN: Hep B E Ag: NEGATIVE

## 2019-01-12 LAB — HEPATITIS PANEL, ACUTE
HCV Ab: <0.1 s/co ratio (ref 0.0–0.9)
Hep A IgM: NEGATIVE
Hep B C IgM: NEGATIVE
Hepatitis B Surface Ag: NEGATIVE

## 2019-01-12 LAB — HEPATITIS B E ANTIBODY: Hep B E Ab: NEGATIVE

## 2019-01-12 LAB — HIV ANTIBODY (ROUTINE TESTING W REFLEX): HIV Screen 4th Generation wRfx: NONREACTIVE

## 2019-01-12 MED ORDER — LEUPROLIDE ACETATE 3.75 MG IM KIT: 3.7500 mg | PACK | Freq: Once | INTRAMUSCULAR | 1 refills | Status: AC

## 2019-01-12 NOTE — Progress Notes (Signed)
Order for Lupron sent to [place].

## 2019-01-12 NOTE — Telephone Encounter (Signed)
Called patient but unable to leave message on VM.

## 2019-01-31 ENCOUNTER — Telehealth: Payer: Self-pay | Admitting: Obstetrics and Gynecology

## 2019-01-31 NOTE — Telephone Encounter (Signed)
Mail box full @ 1:08 pm. Dr. Glo Herring wants pt to have a televisit or webex visit this week. [place]

## 2019-01-31 NOTE — Telephone Encounter (Signed)
Patient called, stated that she was bleeding for about 18 days, then it stopped, now she's on again for 6-7 days.  She stated that she has had to change pads every 30 minutes.  She stated that she is depressed.  [phone]

## 2019-02-01 ENCOUNTER — Other Ambulatory Visit: Payer: Self-pay

## 2019-02-01 ENCOUNTER — Ambulatory Visit (INDEPENDENT_AMBULATORY_CARE_PROVIDER_SITE_OTHER): Payer: Medicaid Other | Admitting: Obstetrics and Gynecology

## 2019-02-01 ENCOUNTER — Encounter (HOSPITAL_COMMUNITY): Payer: Self-pay | Admitting: Emergency Medicine

## 2019-02-01 ENCOUNTER — Encounter: Payer: Self-pay | Admitting: Obstetrics and Gynecology

## 2019-02-01 ENCOUNTER — Emergency Department (HOSPITAL_COMMUNITY): Payer: Medicaid Other

## 2019-02-01 ENCOUNTER — Observation Stay (HOSPITAL_COMMUNITY)
Admission: EM | Admit: 2019-02-01 | Discharge: 2019-02-04 | Disposition: A | Discharge status: Home / Self Care | Payer: Medicaid Other | Attending: Family Medicine | Admitting: Family Medicine

## 2019-02-01 VITALS — Ht 67.0 in | Wt 116.0 lb

## 2019-02-01 DIAGNOSIS — Z20828 Contact with and (suspected) exposure to other viral communicable diseases: Secondary | ICD-10-CM | POA: Diagnosis not present

## 2019-02-01 DIAGNOSIS — D508 Other iron deficiency anemias: Secondary | ICD-10-CM

## 2019-02-01 DIAGNOSIS — D500 Iron deficiency anemia secondary to blood loss (chronic): Principal | ICD-10-CM | POA: Insufficient documentation

## 2019-02-01 DIAGNOSIS — F1010 Alcohol abuse, uncomplicated: Secondary | ICD-10-CM | POA: Diagnosis present

## 2019-02-01 DIAGNOSIS — K700 Alcoholic fatty liver: Secondary | ICD-10-CM | POA: Insufficient documentation

## 2019-02-01 DIAGNOSIS — F10931 Alcohol use, unspecified with withdrawal delirium: Secondary | ICD-10-CM | POA: Diagnosis present

## 2019-02-01 DIAGNOSIS — N920 Excessive and frequent menstruation with regular cycle: Secondary | ICD-10-CM

## 2019-02-01 DIAGNOSIS — K709 Alcoholic liver disease, unspecified: Secondary | ICD-10-CM | POA: Diagnosis present

## 2019-02-01 DIAGNOSIS — F10231 Alcohol dependence with withdrawal delirium: Secondary | ICD-10-CM | POA: Diagnosis present

## 2019-02-01 DIAGNOSIS — N939 Abnormal uterine and vaginal bleeding, unspecified: Secondary | ICD-10-CM | POA: Diagnosis present

## 2019-02-01 DIAGNOSIS — R079 Chest pain, unspecified: Secondary | ICD-10-CM | POA: Insufficient documentation

## 2019-02-01 DIAGNOSIS — D649 Anemia, unspecified: Secondary | ICD-10-CM

## 2019-02-01 DIAGNOSIS — K7010 Alcoholic hepatitis without ascites: Secondary | ICD-10-CM | POA: Diagnosis not present

## 2019-02-01 LAB — BASIC METABOLIC PANEL
Anion gap: 14 (ref 5–15)
BUN: 9 mg/dL (ref 6–20)
CO2: 24 mmol/L (ref 22–32)
Calcium: 8.3 mg/dL — ABNORMAL LOW (ref 8.9–10.3)
Chloride: 98 mmol/L (ref 98–111)
Creatinine, Ser: 0.56 mg/dL (ref 0.44–1.00)
GFR calc Af Amer: >60 mL/min (ref >60)
GFR calc non Af Amer: >60 mL/min (ref >60)
Glucose, Bld: 116 mg/dL — ABNORMAL HIGH (ref 70–99)
Potassium: 2.7 mmol/L — CL (ref 3.5–5.1)
Sodium: 136 mmol/L (ref 135–145)

## 2019-02-01 LAB — CBC WITH DIFFERENTIAL/PLATELET
Abs Immature Granulocytes: 0.02 10*3/uL (ref 0.00–0.07)
Basophils Absolute: 0 10*3/uL (ref 0.0–0.1)
Basophils Relative: 0 %
Eosinophils Absolute: 0 10*3/uL (ref 0.0–0.5)
Eosinophils Relative: 0 %
HCT: 23 % — ABNORMAL LOW (ref 36.0–46.0)
Hemoglobin: 6.1 g/dL — CL (ref 12.0–15.0)
Immature Granulocytes: 1 %
Lymphocytes Relative: 15 %
Lymphs Abs: 0.7 10*3/uL (ref 0.7–4.0)
MCH: 18 pg — ABNORMAL LOW (ref 26.0–34.0)
MCHC: 26.5 g/dL — ABNORMAL LOW (ref 30.0–36.0)
MCV: 68 fL — ABNORMAL LOW (ref 80.0–100.0)
Monocytes Absolute: 0.4 10*3/uL (ref 0.1–1.0)
Monocytes Relative: 8 %
Neutro Abs: 3.3 10*3/uL (ref 1.7–7.7)
Neutrophils Relative %: 76 %
Platelets: 122 10*3/uL — ABNORMAL LOW (ref 150–400)
RBC: 3.38 MIL/uL — ABNORMAL LOW (ref 3.87–5.11)
RDW: 17.6 % — ABNORMAL HIGH (ref 11.5–15.5)
WBC: 4.4 10*3/uL (ref 4.0–10.5)
nRBC: 0 % (ref 0.0–0.2)

## 2019-02-01 LAB — TROPONIN I (HIGH SENSITIVITY): Troponin I (High Sensitivity): 4 ng/L (ref <18)

## 2019-02-01 MED ORDER — POTASSIUM CHLORIDE CRYS ER 20 MEQ PO TBCR
40.0000 meq | EXTENDED_RELEASE_TABLET | Freq: Once | ORAL | Status: AC
  Administered 2019-02-02: 40 meq via ORAL
  Filled 2019-02-01: qty 2

## 2019-02-01 MED ORDER — MEGESTROL ACETATE 40 MG PO TABS
120.0000 mg | ORAL_TABLET | Freq: Every day | ORAL | Status: DC
  Filled 2019-02-01 (×2): qty 3

## 2019-02-01 MED ORDER — TRANEXAMIC ACID 650 MG PO TABS: 1300.0000 mg | ORAL_TABLET | Freq: Three times a day (TID) | ORAL | 2 refills | Status: DC

## 2019-02-01 MED ORDER — MEGESTROL ACETATE 40 MG PO TABS: 40.0000 mg | ORAL_TABLET | Freq: Three times a day (TID) | ORAL | 2 refills | Status: DC

## 2019-02-01 MED ORDER — SODIUM CHLORIDE 0.9% IV SOLUTION
Freq: Once | INTRAVENOUS | Status: AC
  Administered 2019-02-02: via INTRAVENOUS

## 2019-02-01 MED ORDER — ESTROGENS CONJUGATED 25 MG IJ SOLR
25.0000 mg | Freq: Four times a day (QID) | INTRAMUSCULAR | Status: AC
  Administered 2019-02-02 (×4): 25 mg via INTRAVENOUS
  Filled 2019-02-01 (×8): qty 25

## 2019-02-01 MED ORDER — MAGNESIUM SULFATE 2 GM/50ML IV SOLN
2.0000 g | Freq: Once | INTRAVENOUS | Status: AC
  Administered 2019-02-02: 2 g via INTRAVENOUS
  Filled 2019-02-01: qty 50

## 2019-02-01 MED ORDER — POTASSIUM CHLORIDE 10 MEQ/100ML IV SOLN
10.0000 meq | INTRAVENOUS | Status: AC
  Administered 2019-02-02 (×3): 10 meq via INTRAVENOUS
  Filled 2019-02-01 (×3): qty 100

## 2019-02-01 NOTE — ED Notes (Signed)
Date and time results received: 02/01/19 2338 (use smartphrase ".now" to insert current time)  Test: hemoglobin Critical Value: 6.1  Name of Provider Notified: Dr Dayna Barker  Orders Received? Or Actions Taken?: Actions Taken: no orders received

## 2019-02-01 NOTE — ED Notes (Signed)
CRITICAL VALUE ALERT  Critical Value:  Potassium 2.7  Date & Time Notied:  02/01/19 2344  Provider Notified: [place] [facility]  Orders Received/Actions taken: No orders at this time

## 2019-02-01 NOTE — Telephone Encounter (Signed)
Pt has a televisit scheduled for 1:30 today. [place]

## 2019-02-01 NOTE — ED Triage Notes (Signed)
Patient states she is having weakness and dizziness along with chest pain that started tonight . Patient states she has been having vaginal bleeding x 8 days. Patient states she is unable to keep anything on her stomach. Patient states she has been vomiting since last night.
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
## 2019-02-02 ENCOUNTER — Observation Stay (HOSPITAL_COMMUNITY): Payer: Medicaid Other

## 2019-02-02 DIAGNOSIS — F1010 Alcohol abuse, uncomplicated: Secondary | ICD-10-CM | POA: Diagnosis present

## 2019-02-02 DIAGNOSIS — D649 Anemia, unspecified: Secondary | ICD-10-CM

## 2019-02-02 DIAGNOSIS — N920 Excessive and frequent menstruation with regular cycle: Secondary | ICD-10-CM | POA: Diagnosis not present

## 2019-02-02 DIAGNOSIS — K709 Alcoholic liver disease, unspecified: Secondary | ICD-10-CM | POA: Diagnosis present

## 2019-02-02 DIAGNOSIS — R079 Chest pain, unspecified: Secondary | ICD-10-CM | POA: Diagnosis not present

## 2019-02-02 LAB — CBC
HCT: 28.6 % — ABNORMAL LOW (ref 36.0–46.0)
Hemoglobin: 8.2 g/dL — ABNORMAL LOW (ref 12.0–15.0)
MCH: 21.4 pg — ABNORMAL LOW (ref 26.0–34.0)
MCHC: 28.7 g/dL — ABNORMAL LOW (ref 30.0–36.0)
MCV: 74.7 fL — ABNORMAL LOW (ref 80.0–100.0)
Platelets: 135 10*3/uL — ABNORMAL LOW (ref 150–400)
RBC: 3.83 MIL/uL — ABNORMAL LOW (ref 3.87–5.11)
RDW: 18.8 % — ABNORMAL HIGH (ref 11.5–15.5)
WBC: 11.9 10*3/uL — ABNORMAL HIGH (ref 4.0–10.5)
nRBC: 0.2 % (ref 0.0–0.2)

## 2019-02-02 LAB — COMPREHENSIVE METABOLIC PANEL
ALT: 63 U/L — ABNORMAL HIGH (ref 0–44)
AST: 95 U/L — ABNORMAL HIGH (ref 15–41)
Albumin: 3.9 g/dL (ref 3.5–5.0)
Alkaline Phosphatase: 78 U/L (ref 38–126)
Anion gap: 16 — ABNORMAL HIGH (ref 5–15)
BUN: 9 mg/dL (ref 6–20)
CO2: 20 mmol/L — ABNORMAL LOW (ref 22–32)
Calcium: 8.7 mg/dL — ABNORMAL LOW (ref 8.9–10.3)
Chloride: 98 mmol/L (ref 98–111)
Creatinine, Ser: 0.53 mg/dL (ref 0.44–1.00)
GFR calc Af Amer: >60 mL/min (ref >60)
GFR calc non Af Amer: >60 mL/min (ref >60)
Glucose, Bld: 130 mg/dL — ABNORMAL HIGH (ref 70–99)
Potassium: 3.9 mmol/L (ref 3.5–5.1)
Sodium: 134 mmol/L — ABNORMAL LOW (ref 135–145)
Total Bilirubin: 4.6 mg/dL — ABNORMAL HIGH (ref 0.3–1.2)
Total Protein: 7.3 g/dL (ref 6.5–8.1)

## 2019-02-02 LAB — MAGNESIUM
Magnesium: 1 mg/dL — ABNORMAL LOW (ref 1.7–2.4)
Magnesium: 2 mg/dL (ref 1.7–2.4)

## 2019-02-02 LAB — SARS CORONAVIRUS 2 BY RT PCR (HOSPITAL ORDER, PERFORMED IN ~~LOC~~ HOSPITAL LAB): SARS Coronavirus 2: NEGATIVE

## 2019-02-02 LAB — PREPARE RBC (CROSSMATCH)

## 2019-02-02 LAB — TROPONIN I (HIGH SENSITIVITY): Troponin I (High Sensitivity): 2 ng/L (ref <18)

## 2019-02-02 MED ORDER — VITAMIN B-1 100 MG PO TABS
100.0000 mg | ORAL_TABLET | Freq: Every day | ORAL | Status: DC
  Administered 2019-02-02 – 2019-02-04 (×3): 100 mg via ORAL
  Filled 2019-02-02 (×3): qty 1

## 2019-02-02 MED ORDER — STERILE WATER FOR INJECTION IJ SOLN
INTRAMUSCULAR | Status: AC
  Administered 2019-02-02: 5 mL
  Filled 2019-02-02: qty 10

## 2019-02-02 MED ORDER — SODIUM CHLORIDE 0.9% FLUSH: 3.0000 mL | INTRAVENOUS | Status: DC | PRN

## 2019-02-02 MED ORDER — THIAMINE HCL 100 MG/ML IJ SOLN: 100.0000 mg | Freq: Every day | INTRAMUSCULAR | Status: DC

## 2019-02-02 MED ORDER — HYDROMORPHONE HCL 1 MG/ML IJ SOLN
0.5000 mg | INTRAMUSCULAR | Status: DC | PRN
  Administered 2019-02-02 – 2019-02-03 (×6): 0.5 mg via INTRAVENOUS
  Filled 2019-02-02 (×6): qty 0.5

## 2019-02-02 MED ORDER — MAGNESIUM SULFATE 2 GM/50ML IV SOLN
2.0000 g | Freq: Once | INTRAVENOUS | Status: AC
  Administered 2019-02-02: 2 g via INTRAVENOUS
  Filled 2019-02-02: qty 50

## 2019-02-02 MED ORDER — DIAZEPAM 2 MG PO TABS
2.0000 mg | ORAL_TABLET | Freq: Three times a day (TID) | ORAL | Status: AC
  Administered 2019-02-02 – 2019-02-03 (×3): 2 mg via ORAL
  Filled 2019-02-02 (×3): qty 1

## 2019-02-02 MED ORDER — ONDANSETRON HCL 4 MG/2ML IJ SOLN
INTRAMUSCULAR | Status: AC
  Administered 2019-02-02: 4 mg via INTRAVENOUS
  Filled 2019-02-02: qty 2

## 2019-02-02 MED ORDER — SODIUM CHLORIDE 0.9 % IV SOLN
510.0000 mg | Freq: Once | INTRAVENOUS | Status: AC
  Administered 2019-02-03: 510 mg via INTRAVENOUS
  Filled 2019-02-02: qty 17

## 2019-02-02 MED ORDER — TRANEXAMIC ACID 650 MG PO TABS
1300.0000 mg | ORAL_TABLET | Freq: Three times a day (TID) | ORAL | Status: DC
  Filled 2019-02-02 (×4): qty 2

## 2019-02-02 MED ORDER — ONDANSETRON HCL 4 MG/2ML IJ SOLN
4.0000 mg | Freq: Four times a day (QID) | INTRAMUSCULAR | Status: DC | PRN
  Filled 2019-02-02: qty 2

## 2019-02-02 MED ORDER — FOLIC ACID 1 MG PO TABS
1.0000 mg | ORAL_TABLET | Freq: Every day | ORAL | Status: DC
  Administered 2019-02-02 – 2019-02-04 (×3): 1 mg via ORAL
  Filled 2019-02-02 (×3): qty 1

## 2019-02-02 MED ORDER — ACETAMINOPHEN 325 MG PO TABS
650.0000 mg | ORAL_TABLET | Freq: Four times a day (QID) | ORAL | Status: DC | PRN
  Administered 2019-02-02 – 2019-02-04 (×2): 650 mg via ORAL
  Filled 2019-02-02 (×2): qty 2

## 2019-02-02 MED ORDER — LORAZEPAM 2 MG/ML IJ SOLN: 1.0000 mg | Freq: Four times a day (QID) | INTRAMUSCULAR | Status: DC | PRN

## 2019-02-02 MED ORDER — MEGESTROL ACETATE 40 MG PO TABS
40.0000 mg | ORAL_TABLET | Freq: Three times a day (TID) | ORAL | Status: DC
  Administered 2019-02-02 – 2019-02-04 (×8): 40 mg via ORAL
  Filled 2019-02-02 (×13): qty 1

## 2019-02-02 MED ORDER — FERROUS SULFATE 325 (65 FE) MG PO TABS
325.0000 mg | ORAL_TABLET | Freq: Every day | ORAL | Status: DC
  Administered 2019-02-02 – 2019-02-04 (×3): 325 mg via ORAL
  Filled 2019-02-02 (×3): qty 1

## 2019-02-02 MED ORDER — ACETAMINOPHEN 650 MG RE SUPP: 650.0000 mg | Freq: Four times a day (QID) | RECTAL | Status: DC | PRN

## 2019-02-02 MED ORDER — SODIUM CHLORIDE 0.9 % IV SOLN: 250.0000 mL | INTRAVENOUS | Status: DC | PRN

## 2019-02-02 MED ORDER — TRANEXAMIC ACID-NACL 1000-0.7 MG/100ML-% IV SOLN
1000.0000 mg | Freq: Once | INTRAVENOUS | Status: AC
  Administered 2019-02-02: 1000 mg via INTRAVENOUS
  Filled 2019-02-02: qty 100

## 2019-02-02 MED ORDER — ONDANSETRON HCL 4 MG PO TABS
4.0000 mg | ORAL_TABLET | Freq: Four times a day (QID) | ORAL | Status: DC | PRN
  Administered 2019-02-02: 10:00:00 4 mg via ORAL

## 2019-02-02 MED ORDER — ONDANSETRON HCL 4 MG/2ML IJ SOLN
4.0000 mg | Freq: Once | INTRAMUSCULAR | Status: AC
  Administered 2019-02-02: 4 mg via INTRAVENOUS

## 2019-02-02 MED ORDER — SODIUM CHLORIDE 0.9% FLUSH
3.0000 mL | Freq: Two times a day (BID) | INTRAVENOUS | Status: DC
  Administered 2019-02-02 – 2019-02-04 (×5): 3 mL via INTRAVENOUS

## 2019-02-02 MED ORDER — LORAZEPAM 1 MG PO TABS
1.0000 mg | ORAL_TABLET | Freq: Four times a day (QID) | ORAL | Status: DC | PRN
  Administered 2019-02-03: 1 mg via ORAL
  Filled 2019-02-02: qty 1

## 2019-02-02 MED ORDER — ADULT MULTIVITAMIN W/MINERALS CH
1.0000 | ORAL_TABLET | Freq: Every day | ORAL | Status: DC
  Administered 2019-02-02 – 2019-02-04 (×3): 1 via ORAL
  Filled 2019-02-02 (×3): qty 1

## 2019-02-02 MED ORDER — LEUPROLIDE ACETATE 3.75 MG IM KIT
3.7500 mg | PACK | Freq: Once | INTRAMUSCULAR | Status: AC
  Administered 2019-02-03: 3.75 mg via INTRAMUSCULAR
  Filled 2019-02-02 (×2): qty 3.75
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
## 2019-02-02 NOTE — Progress Notes (Signed)
   Patient seen and evaluated, chart reviewed, please see EMR for updated orders. Please see full H&P dictated by admitting physician Dr Darrick Meigs for same date of service.    44 year old female with past medical history relevant for alcoholic hepatitis and uterine fibroids admitted on 02/02/2019 with symptomatic anemia secondary to menorrhagia hemoglobin was down to 6.1  received 2 units of packed cells  --IV estrogen as ordered by OB/GYN --Oral Megace and iv tranexamic acid Consult from Dr. Glo Herring OB/GYN appreciated  Possible discharge on 02/03/2019 if hemodynamically stable and H&H is stable otherwise may need additional transfusion  Patient seen and evaluated, chart reviewed, please see EMR for updated orders. Please see full H&P dictated by admitting physician Dr Darrick Meigs for same date of service.

## 2019-02-02 NOTE — TOC Initial Note (Signed)
Transition of Care [facility]) - Initial/Assessment Note    Patient Details  Name: Brittney Melendez MRN: [id number] Date of Birth: 17-Oct-1975  [facility]) CM/SW Contact:    Boneta Lucks, RN Phone Number: 02/02/2019, 11:16 AM  Clinical Narrative:    Patient admitted for Menorrhagia.  Sw consult for the need of a PCP.  Patient lives in [place], [place] was able to make a follow up appointment with [facility] in [place] for July 2nd at 3:30, added information to AVS and gave information with address to the Patient. Patient is aware this first visit will be a face time phone appointment.               Expected Discharge Plan: Home/Self Care     Patient Goals and CMS Choice Patient states their goals for this hospitalization and ongoing recovery are:: to go home.      Expected Discharge Plan and Services Expected Discharge Plan: Home/Self Care                              Activities of Daily Living Home Assistive Devices/Equipment: None ADL Screening (condition at time of admission) Patient's cognitive ability adequate to safely complete daily activities?: Yes Is the patient deaf or have difficulty hearing?: No Does the patient have difficulty seeing, even when wearing glasses/contacts?: No Does the patient have difficulty concentrating, remembering, or making decisions?: No Patient able to express need for assistance with ADLs?: Yes Does the patient have difficulty dressing or bathing?: Yes Independently performs ADLs?: No Communication: Independent Dressing (OT): Needs assistance Is this a change from baseline?: Change from baseline, expected to last <3days Grooming: Needs assistance Is this a change from baseline?: Change from baseline, expected to last <3 days Feeding: Independent Bathing: Needs assistance Is this a change from baseline?: Change from baseline, expected to last <3 days Toileting: Needs assistance Is this a change from baseline?: Change  from baseline, expected to last <3 days In/Out Bed: Needs assistance Is this a change from baseline?: Change from baseline, expected to last <3 days Walks in Home: Needs assistance Is this a change from baseline?: Change from baseline, expected to last <3 days Does the patient have difficulty walking or climbing stairs?: Yes Weakness of Legs: Both Weakness of Arms/Hands: None  Permission Sought/Granted                  Emotional Assessment       Orientation: : Oriented to Self, Oriented to Place, Oriented to  Time, Oriented to Situation Alcohol / Substance Use: Not Applicable Psych Involvement: No (comment)  Admission diagnosis:  Chest pain [R07.9] Symptomatic anemia [D64.9] Patient Active Problem List   Diagnosis Date Noted  . 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 Hemorrhoids   . Abnormal uterine bleeding 06/16/2018  . Bicytopenia 06/16/2018  . Thrombocytopenia ([place]) 06/16/2018  . Hypomagnesemia 06/16/2018  . Nocturia 06/16/2018  . Abnormal platelet function ([place]) 06/16/2018  . Lactic acidosis 06/16/2018  . Chest pain 06/16/2018  . Dyspnea 06/16/2018  . Symptomatic anemia 12/27/2016  . Menorrhagia 08/19/2016  . Hypokalemia 08/19/2016  . Anemia due to chronic blood loss 08/19/2016  . Nausea & vomiting 08/19/2016  . Diarrhea 08/19/2016  . UTI (urinary tract infection) with pyuria 08/19/2016  . Anemia 08/19/2016   PCP:  [name], MD Pharmacy:   [id number] [place], [place]  [place] [address] Phone: [phone] Fax: [phone]  [facility] Sites - [place], [place] [address] Phone: [phone] Fax: [phone]

## 2019-02-02 NOTE — Plan of Care (Signed)
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
## 2019-02-02 NOTE — Progress Notes (Signed)
Patient having moderate to heavy vaginal bleeding with clots noted.  VSS.  Blood infusing per order. Will continue to monitor patient and make MD aware of changes as needed.  P.J. Linus Mako, RN

## 2019-02-03 DIAGNOSIS — D500 Iron deficiency anemia secondary to blood loss (chronic): Principal | ICD-10-CM

## 2019-02-03 DIAGNOSIS — F10231 Alcohol dependence with withdrawal delirium: Secondary | ICD-10-CM | POA: Diagnosis present

## 2019-02-03 DIAGNOSIS — F10931 Alcohol use, unspecified with withdrawal delirium: Secondary | ICD-10-CM | POA: Diagnosis present

## 2019-02-03 LAB — CBC
HCT: 24.1 % — ABNORMAL LOW (ref 36.0–46.0)
Hemoglobin: 7 g/dL — ABNORMAL LOW (ref 12.0–15.0)
MCH: 21.5 pg — ABNORMAL LOW (ref 26.0–34.0)
MCHC: 29 g/dL — ABNORMAL LOW (ref 30.0–36.0)
MCV: 73.9 fL — ABNORMAL LOW (ref 80.0–100.0)
Platelets: 123 10*3/uL — ABNORMAL LOW (ref 150–400)
RBC: 3.26 MIL/uL — ABNORMAL LOW (ref 3.87–5.11)
RDW: 19 % — ABNORMAL HIGH (ref 11.5–15.5)
WBC: 7.8 10*3/uL (ref 4.0–10.5)
nRBC: 0.4 % — ABNORMAL HIGH (ref 0.0–0.2)

## 2019-02-03 LAB — PREPARE RBC (CROSSMATCH)

## 2019-02-03 MED ORDER — SODIUM CHLORIDE 0.9% IV SOLUTION
Freq: Once | INTRAVENOUS | Status: AC
  Administered 2019-02-03: 14:00:00 via INTRAVENOUS

## 2019-02-03 MED ORDER — FUROSEMIDE 10 MG/ML IJ SOLN
20.0000 mg | Freq: Once | INTRAMUSCULAR | Status: AC
  Administered 2019-02-03: 20 mg via INTRAVENOUS
  Filled 2019-02-03: qty 2

## 2019-02-03 MED ORDER — METHOCARBAMOL 500 MG PO TABS
750.0000 mg | ORAL_TABLET | Freq: Once | ORAL | Status: AC
  Administered 2019-02-03: 23:00:00 750 mg via ORAL
  Filled 2019-02-03: qty 2

## 2019-02-03 MED ORDER — FERUMOXYTOL INJECTION 510 MG/17 ML
INTRAVENOUS | Status: AC
  Filled 2019-02-03: qty 17

## 2019-02-03 MED ORDER — LORAZEPAM 2 MG/ML IJ SOLN
1.0000 mg | Freq: Once | INTRAMUSCULAR | Status: AC
  Administered 2019-02-03: 1 mg via INTRAVENOUS
  Filled 2019-02-03: qty 1

## 2019-02-03 NOTE — Discharge Summary (Signed)
Physician Discharge Summary  Patient ID: VENA BASSINGER MRN: [id number] DOB/AGE: 1975/09/09 43 y.o.  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 female, with history of Menorrhagia, anemia due to chronic blood loss requiring blood transfusions, hypokalemia came to ED with complaints of abnormal uterine bleeding for past 8 days.  Patient says that she had multiple presyncopal episodes at home.  Also has been feeling dizzy and blackout when she stands.  Complains of chest pain, shortness of breath and dizziness at rest. Patient has been taking Megace and Trexanamic acid at home. In the ED hemoglobin was found to be 6.1.  2 units of PRBC have been ordered. ED physician discussed with OB/GYN who recommended to give Premarin 25 mg every 6 hours, Megace 120 mg daily.  Dr. Glo Herring will see patient in a.m. She denies nausea vomiting or diarrhea. Denies fever or chills. No previous history of stroke or seizures. Patient was sitting admitted, received 2 units packed cells, with IV Premarin, oral Megace.  She was added to have tranexamic acid 1 g intravenous and she improved her bleeding significantly.  Post transfusion hemoglobin was 8.2 tolerated adequately by patient.  She received multivitamins and folate intravenously, suspected to have a history of chronic alcoholism.  Patient did report that she has dramatically reduced her alcohol consumption the records indicate recent elevated alcohol levels. She was given Feraheme 500 mg intravenous, and receive Lupron 3.75 mg IM to induce postmenopausal status to allow her to become  amenorrheic temporarily as well as discharged home on oral iron and multivitamins.  She is discharged home on Megace 40 mg 3 times daily which had been prescribed the day before admission She will be asked to follow-up in less than a month at our office for follow-up on her hemoglobin and consideration for hysterectomy.  She will likely need STI screening, confirmation of recent Pap and probable endometrial biopsy as part of preop work-up  Consults: gynecology  Significant Diagnostic Studies: labs:  CBC Latest Ref Rng & Units 02/03/2019 02/02/2019 02/01/2019  WBC 4.0 - 10.5 K/uL 7.8 11.9(H) 4.4  Hemoglobin 12.0 - 15.0 g/dL 7.0(L) 8.2(L) 6.1(LL)  Hematocrit 36.0 - 46.0 % 24.1(L) 28.6(L) 23.0(L)  Platelets 150 - 400 K/uL 123(L) 135(L) 122(L)     Treatments: IV hydration and, transfusion 2 units packed cells,, tranexamic acid intravenous, IV Premarin, Lupron prior to discharge  Discharge Exam: Blood pressure 124/75, pulse 97, temperature 98.5 F (36.9 C), temperature source Oral, resp. rate 18, height 5\' 7"  (1.702 m), weight 73.9 kg, last menstrual period 01/27/2019, SpO2 99 %. General appearance: alert, cooperative and no distress Head: Normocephalic, without obvious abnormality, atraumatic GI: soft, non-tender; bowel sounds normal; no masses,  no organomegaly Extremities: extremities normal, atraumatic, no cyanosis or edema and Homans sign is negative, no sign of DVT  Disposition: Discharge disposition: 01-Home or Self Care       Discharge Instructions    Call MD for:  extreme fatigue   Complete by: As directed    Call MD for:  temperature >100.4   Complete by: As directed    Diet - low sodium heart healthy   Complete by: As directed  Increase activity slowly   Complete by: As directed      Allergies as of 02/03/2019      Reactions   Rocephin [ceftriaxone] Rash   Sulfa Antibiotics Itching      Medication List    STOP taking these medications   tranexamic acid 650 MG  Tabs tablet Commonly known as: Lysteda     TAKE these medications   chlordiazePOXIDE 25 MG capsule Commonly known as: LIBRIUM 50mg  PO TID x 1D, then 25-50mg  PO BID X 1D, then 25-50mg  PO QD X 1D   ferrous sulfate 325 (65 FE) MG tablet Take 1 tablet (325 mg total) by mouth daily.   megestrol 40 MG tablet Commonly known as: MEGACE Take 1 tablet (40 mg total) by mouth 3 (three) times daily.   MULTIVITAMIN/IRON PO Take 1 tablet by mouth daily.   potassium chloride SA 20 MEQ tablet Commonly known as: K-DUR Take 1 tablet (20 mEq total) by mouth 2 (two) times daily.      [place]. Call in 7 day(s).   Why: [facility]  Phone appointment July 2nd at 3:00 with nurse and 3:30 with doctor Contact information: [place] [address] [phone]        [name], MD Follow up in 3 week(s).   Specialties: Obstetrics and Gynecology, Radiology Contact information: [place] [address] [phone]           Signed: [name] 02/03/2019, 7:24 AM

## 2019-02-03 NOTE — Progress Notes (Signed)
Patient Demographics:    Brittney Melendez, is a 43 y.o. female, DOB - Nov 11, 1975, DTO:[id number]  Admit date - 02/01/2019   Admitting Physician Oswald Hillock, MD  Outpatient Primary MD for the patient is [name], MD  LOS - 0   Chief Complaint  Patient presents with   Dizziness    chest pain        Subjective:    Brittney Melendez today has no fevers, no emesis,  No chest pain, patient is having tremors/shakes... 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 Glo Herring OB/GYN appreciated.Marland Kitchen She will follow-up as outpatient with Dr. Glo Herring  2) acute on chronic symptomatic anemia secondary to acute blood loss in the setting of menorrhagia--- hemoglobin is back down to 7.0, patient is very orthostatic, dizzy, with dyspnea on exertion-- Risk, benefits and alternatives to transfusion of blood products discussed. Indication for transfusion discussed. Consent obtained Please Transfuse 1 unit of packed red blood cells,     3)DT--- delirium tremens in the setting of alcohol abuse requiring IV benzos--- continue CIWA protocol and multivitamins   Disposition/Need for in-Hospital Stay- patient unable to be discharged at this time due to delirium tremens in the setting of alcohol abuse requiring IV benzos--requires blood transfusion for symptomatic anemia  Code Status : full   Family Communication:   NA-patient is alert and coherent   Disposition Plan  : We will discharge home on 02/04/2019  Consults  :  *gyn  DVT Prophylaxis  :   SCDs **  Lab Results  Component Value Date   PLT 123 (L) 02/03/2019    Inpatient Medications  Scheduled Meds:  ferrous sulfate  325 mg Oral Daily   folic acid  1 mg Oral Daily   LORazepam  1 mg Intravenous Once   megestrol  40 mg Oral TID   multivitamin with minerals  1 tablet Oral Daily   sodium chloride flush  3 mL Intravenous Q12H   thiamine  100 mg Oral Daily   Or   thiamine  100 mg Intravenous Daily   Continuous Infusions:  sodium chloride     PRN Meds:.sodium chloride, acetaminophen **OR** acetaminophen, HYDROmorphone (DILAUDID) injection, LORazepam **OR** LORazepam, ondansetron **OR** ondansetron (ZOFRAN) IV, sodium chloride flush    Anti-infectives (From admission, onward)   None        Objective:   Vitals:   02/02/19 2108 02/03/19 0509 02/03/19 1617 02/03/19 1632  BP:  116/65 124/75 104/70 105/72  Pulse: 77 97 82 82  Resp: 17 18 20 20   Temp: 98.2 F (36.8 C) 98.5 F (36.9 C) 98.7 F (37.1 C) 98.6 F (37 C)  TempSrc: Oral Oral Oral Oral  SpO2: 100% 99% 100% 100%  Weight:      Height:        Wt Readings from Last 3 Encounters:  02/01/19 73.9 kg  02/01/19 52.6 kg  01/11/19 73.9 kg     Intake/Output Summary (Last 24 hours) at 02/03/2019 1849 Last data filed at 02/03/2019 1617 Gross per 24 hour  Intake 204.79 ml  Output --  Net 204.79 ml     Physical Exam    Gen:- Awake Alert, planes of dizziness and dyspnea on  exertion HEENT:- [place].AT, No sclera icterus Neck-Supple Neck,No JVD,.  Lungs-  CTAB , fair symmetrical air movement CV- S1, S2 normal, regular  Abd-  +ve B.Sounds, Abd Soft, No tenderness,    Extremity/Skin:- No  edema, pedal pulses present  Psych-affect is appropriate, oriented x3 Neuro- , tremors/shakes   Data Review:   Micro Results Recent Results (from the past 240 hour(s))  SARS Coronavirus 2 (CEPHEID - Performed in [facility] hospital lab), Hosp Order     Status: None   Collection Time: 02/02/19  2:35 AM   Specimen: Nasopharyngeal Swab  Result Value Ref Range Status   SARS Coronavirus 2 NEGATIVE NEGATIVE Final    Comment: (NOTE) If result is NEGATIVE SARS-CoV-2 target nucleic acids are NOT DETECTED. The SARS-CoV-2 RNA is generally detectable in upper and lower  respiratory specimens during the acute phase of infection. The lowest  concentration of SARS-CoV-2 viral copies this assay can detect is 250  copies / mL. A negative result does not preclude SARS-CoV-2 infection  and should not be used as the sole basis for treatment or other  patient management decisions.  A negative result may occur with  improper specimen collection / handling, submission of specimen other  than nasopharyngeal swab, presence of viral mutation(s) within the  areas targeted by this assay, and inadequate number of viral copies  (<250 copies / mL). A negative result must be combined with clinical  observations, patient history, and epidemiological information. If result is POSITIVE SARS-CoV-2 target nucleic acids are DETECTED. The SARS-CoV-2 RNA is generally detectable in upper and lower  respiratory specimens dur ing the acute phase of infection.  Positive  results are indicative of active infection with SARS-CoV-2.  Clinical  correlation with patient history and other diagnostic information is  necessary to determine patient infection status.  Positive results do  not rule out bacterial infection  or co-infection with other viruses. If result is PRESUMPTIVE POSTIVE SARS-CoV-2 nucleic acids MAY BE PRESENT.   A presumptive positive result was obtained on the submitted specimen  and confirmed on repeat testing.  While 2019 novel coronavirus  (SARS-CoV-2) nucleic acids may be present in the submitted sample  additional confirmatory testing may be necessary for epidemiological  and / or clinical management purposes  to differentiate between  SARS-CoV-2 and other Sarbecovirus currently known to infect humans.  If clinically indicated additional testing with an alternate test  methodology [phone]) is advised. The SARS-CoV-2 RNA is generally  detectable in upper and lower respiratory sp ecimens during the acute  phase of infection. The expected result is Negative. Fact Sheet for Patients:  StrictlyIdeas.no Fact Sheet for Healthcare Providers: BankingDealers.co.za This test is not yet approved or cleared by the [place] FDA and  has been authorized for detection and/or diagnosis of SARS-CoV-2 by FDA under an Emergency Use Authorization (EUA).  This EUA will remain in effect (meaning this test can be used) for the duration of the COVID-19 declaration under Section 564(b)(1) of the Act, 21 U.S.C. section 360bbb-3(b)(1), unless the authorization is terminated or revoked sooner. Performed at [facility], [address], [place],  [address]     Radiology Reports Dg Chest 2 View  Result Date: 01/11/2019 CLINICAL DATA:  Weakness for 3 days. EXAM: CHEST - 2 VIEW COMPARISON:  PA and lateral chest 10/28/2018 and 06/16/2018. FINDINGS: Lungs clear. Heart size normal. No pneumothorax or pleural fluid. No bony abnormality. IMPRESSION: Negative chest. Electronically Signed   By: Inge Rise M.D.   On: 01/11/2019 12:26   Dg Chest Port 1 View  Result Date: 02/01/2019 CLINICAL DATA:  Chest pain EXAM: PORTABLE CHEST 1 VIEW COMPARISON:   01/11/2019, 10/28/2018 FINDINGS: The heart size and mediastinal contours are within normal limits. Both lungs are clear. The visualized skeletal structures are unremarkable. IMPRESSION: No active disease. Electronically Signed   By: Donavan Foil M.D.   On: 02/01/2019 23:47   US Pelvic Complete With Transvaginal  Result Date: 02/02/2019 CLINICAL DATA:  43 year old female with severe pelvic bleeding for 8 days. LMP 01/25/2019. History of fibroids. EXAM: TRANSABDOMINAL AND TRANSVAGINAL ULTRASOUND OF PELVIS TECHNIQUE: Both transabdominal and transvaginal ultrasound examinations of the pelvis were performed. Transabdominal technique was performed for global imaging of the pelvis including uterus, ovaries, adnexal regions, and pelvic cul-de-sac. It was necessary to proceed with endovaginal exam following the transabdominal exam to visualize the ovaries. COMPARISON:  Pelvis ultrasound 06/17/2018. FINDINGS: Uterus Measurements: 10.6 x 5.7 x 7.5 centimeters = volume: 238 mL. Anteverted. Intramural fibroid at the left fundus measuring 28 millimeters diameter is stable (image 122). Stable subserosal fibroid at the right fundus measuring 23 millimeters diameter. Small cystic areas at the cervix are redemonstrated with no internal vascularity (image 195) and likely nabothian cysts although there is some mild internal complexity (image 200). Endometrium Thickness: 5 millimeters.  No focal abnormality visualized. Right ovary Measurements: 4.3 x 2.2 x 2.7 centimeters = volume: 13 mL. Partially exophytic 19 millimeter simple cyst or follicle (image 008). Other small follicles. Left ovary Measurements: 3.2 x 1.7 x 2.1 centimeters = volume: 6 mL. Several small follicles. Other findings Trace pelvic free fluid. IMPRESSION: 1. Stable fibroids, otherwise negative uterus. 2. Physiologic appearance of the ovaries. Trace pelvic free fluid is likely physiologic. Electronically Signed   By: Genevie Ann M.D.   On: 02/02/2019 11:33   US  Abdomen Limited Ruq  Result Date: 01/11/2019 CLINICAL DATA:  Ethanol abuse, elevated LFTs EXAM: ULTRASOUND ABDOMEN LIMITED RIGHT UPPER QUADRANT COMPARISON:  12/28/2016 FINDINGS: Gallbladder: Normally distended without stones or wall thickening. No pericholecystic fluid or sonographic Murphy sign. Common bile duct: Diameter: 2 mm diameter, normal Liver: Echogenic parenchyma, likely fatty infiltration though this can be seen with cirrhosis and certain infiltrative disorders. No focal hepatic mass or nodularity identified. Portal vein is patent on color Doppler imaging with normal direction of blood flow towards the liver. No RIGHT upper quadrant free fluid IMPRESSION: Probable fatty infiltration of liver as above. Otherwise negative exam. Electronically Signed   By: Lavonia Dana M.D.   On: 01/11/2019 15:49     CBC Recent Labs  Lab 02/01/19 2253 02/02/19 0947 02/03/19 0643  WBC 4.4 11.9* 7.8  HGB 6.1* 8.2* 7.0*  HCT 23.0* 28.6* 24.1*  PLT 122* 135* 123*  MCV 68.0* 74.7* 73.9*  MCH 18.0* 21.4* 21.5*  MCHC 26.5* 28.7* 29.0*  RDW 17.6* 18.8* 19.0*  LYMPHSABS 0.7  --   --   MONOABS 0.4  --   --   EOSABS 0.0  --   --   BASOSABS 0.0  --   --     Chemistries  Recent Labs  Lab 02/01/19 2253 02/02/19 0947  NA 136 134*  K 2.7* 3.9  CL 98 98  CO2 24 20*  GLUCOSE 116* 130*  BUN 9 9  CREATININE 0.56 0.53  CALCIUM 8.3* 8.7*  MG 1.0* 2.0  AST  --  95*  ALT  --  63*  ALKPHOS  --  78  BILITOT  --  4.6*   ------------------------------------------------------------------------------------------------------------------ No results for input(s): CHOL, HDL, LDLCALC, TRIG, CHOLHDL, LDLDIRECT in the last 72 hours.  Lab Results  Component Value Date   HGBA1C 4.4 (L) 06/16/2018   ------------------------------------------------------------------------------------------------------------------ No results for input(s): TSH, T4TOTAL, T3FREE, THYROIDAB in the last 72 hours.  Invalid input(s):  FREET3 ------------------------------------------------------------------------------------------------------------------ No results for input(s): VITAMINB12, FOLATE, FERRITIN, TIBC, IRON, RETICCTPCT in the last 72 hours.  Coagulation profile No results for input(s): INR, PROTIME in the last 168 hours.  No results for input(s): DDIMER in the last 72 hours.  Cardiac Enzymes No results for input(s): CKMB, TROPONINI, MYOGLOBIN in the last 168 hours.  Invalid input(s): CK ------------------------------------------------------------------------------------------------------------------ No results found for: BNP   Roxan Hockey M.D on 02/03/2019 at 6:49 PM  Go to [email or web address] - for contact info  [facility] - Office  [phone]

## 2019-02-03 NOTE — Plan of Care (Signed)

## 2019-02-03 NOTE — Progress Notes (Signed)
Subjective: HD 3 for acute anemia due to menorrhagia  Patient reports she has minimal bleeding. Passed one clot Has been on premarin and received TXA. Marland Kitchen    Objective: I have reviewed patient's medications and labs.  General: alert, cooperative and no distress GI: normal findings: soft, non-tender Vaginal Bleeding: minimal   Assessment/Plan: Stable for d/c To receive LUPRON before d/c F/u 2 wk to [facility] preop assessment for hysterectomy, needs endometrial biopsy, review pap.  LOS: 0 days    [name] 02/03/2019, 7:16 AM

## 2019-02-04 DIAGNOSIS — D500 Iron deficiency anemia secondary to blood loss (chronic): Secondary | ICD-10-CM | POA: Diagnosis not present

## 2019-02-04 LAB — BPAM RBC
Blood Product Expiration Date: 202007012359
Blood Product Expiration Date: 202007012359
Blood Product Expiration Date: 202007242359
ISSUE DATE / TIME: 202006250147
ISSUE DATE / TIME: 202006250511
ISSUE DATE / TIME: 202006261614
Unit Type and Rh: 6200
Unit Type and Rh: 6200
Unit Type and Rh: 6200

## 2019-02-04 LAB — TYPE AND SCREEN
ABO/RH(D): A POS
Antibody Screen: NEGATIVE
Unit division: 0
Unit division: 0
Unit division: 0

## 2019-02-04 LAB — BASIC METABOLIC PANEL
Anion gap: 10 (ref 5–15)
BUN: 10 mg/dL (ref 6–20)
CO2: 28 mmol/L (ref 22–32)
Calcium: 9.2 mg/dL (ref 8.9–10.3)
Chloride: 98 mmol/L (ref 98–111)
Creatinine, Ser: 0.53 mg/dL (ref 0.44–1.00)
GFR calc Af Amer: >60 mL/min (ref >60)
GFR calc non Af Amer: >60 mL/min (ref >60)
Glucose, Bld: 102 mg/dL — ABNORMAL HIGH (ref 70–99)
Potassium: 3.1 mmol/L — ABNORMAL LOW (ref 3.5–5.1)
Sodium: 136 mmol/L (ref 135–145)

## 2019-02-04 LAB — CBC
HCT: 31 % — ABNORMAL LOW (ref 36.0–46.0)
Hemoglobin: 9.3 g/dL — ABNORMAL LOW (ref 12.0–15.0)
MCH: 22.6 pg — ABNORMAL LOW (ref 26.0–34.0)
MCHC: 30 g/dL (ref 30.0–36.0)
MCV: 75.2 fL — ABNORMAL LOW (ref 80.0–100.0)
Platelets: 147 10*3/uL — ABNORMAL LOW (ref 150–400)
RBC: 4.12 MIL/uL (ref 3.87–5.11)
RDW: 21.4 % — ABNORMAL HIGH (ref 11.5–15.5)
WBC: 8.9 10*3/uL (ref 4.0–10.5)
nRBC: 0.4 % — ABNORMAL HIGH (ref 0.0–0.2)

## 2019-02-04 MED ORDER — POTASSIUM CHLORIDE CRYS ER 20 MEQ PO TBCR
40.0000 meq | EXTENDED_RELEASE_TABLET | Freq: Once | ORAL | Status: AC
  Administered 2019-02-04: 40 meq via ORAL
  Filled 2019-02-04: qty 2

## 2019-02-04 MED ORDER — CHLORDIAZEPOXIDE HCL 25 MG PO CAPS: ORAL_CAPSULE | ORAL | 0 refills | Status: DC

## 2019-02-04 MED ORDER — LORAZEPAM 2 MG/ML IJ SOLN
1.0000 mg | Freq: Once | INTRAMUSCULAR | Status: AC
  Administered 2019-02-04: 1 mg via INTRAVENOUS
  Filled 2019-02-04: qty 1

## 2019-02-04 MED ORDER — FERROUS SULFATE 325 (65 FE) MG PO TABS: 325.0000 mg | ORAL_TABLET | Freq: Two times a day (BID) | ORAL | 0 refills | Status: AC

## 2019-02-04 MED ORDER — POTASSIUM CHLORIDE CRYS ER 20 MEQ PO TBCR: 20.0000 meq | EXTENDED_RELEASE_TABLET | Freq: Two times a day (BID) | ORAL | 0 refills | Status: DC

## 2019-02-04 MED ORDER — MEGESTROL ACETATE 40 MG PO TABS: 40.0000 mg | ORAL_TABLET | Freq: Three times a day (TID) | ORAL | 2 refills | Status: DC

## 2019-02-04 MED ORDER — FOLIC ACID 1 MG PO TABS: 1.0000 mg | ORAL_TABLET | Freq: Every day | ORAL | 3 refills | Status: DC

## 2019-02-04 MED ORDER — CYCLOBENZAPRINE HCL 10 MG PO TABS
5.0000 mg | ORAL_TABLET | Freq: Three times a day (TID) | ORAL | Status: DC | PRN
  Administered 2019-02-04 (×2): 5 mg via ORAL
  Filled 2019-02-04 (×2): qty 1

## 2019-02-04 MED ORDER — POTASSIUM CHLORIDE CRYS ER 20 MEQ PO TBCR
40.0000 meq | EXTENDED_RELEASE_TABLET | ORAL | Status: DC
  Administered 2019-02-04: 40 meq via ORAL
  Filled 2019-02-04: qty 2

## 2019-02-04 MED ORDER — POTASSIUM CHLORIDE CRYS ER 20 MEQ PO TBCR: 40.0000 meq | EXTENDED_RELEASE_TABLET | Freq: Once | ORAL | Status: DC

## 2019-02-04 NOTE — Progress Notes (Signed)
I concur with this students documentation- Danton Sewer, Nursing Student [employer]

## 2019-02-04 NOTE — Progress Notes (Addendum)
Discharge instructions given to patient  Including medication information. IV discontinued, patient verbalized understanding. Patient has called family to pick her up. Patient to be discharged home.  I concur with this student's documentation" Brittney Melendez. Hall Busing, RN, MSN, CCM

## 2019-02-04 NOTE — Discharge Instructions (Signed)
1)Follow up with Dr. Glo Herring the gynecologist as previously scheduled 2)Complete Abstinence from Alcohol advised--- please consider Alcohol Rehab program including AA meetings 3)Please take your medications as prescribed 4)You Need to repeat CBC and BMP blood test within a week with your primary care physician or with Dr. Glo Herring   1.  Is important for you to take iron tablets as well as the Megace.  Please take the Megace 3 times a day until the bleeding is minimal and then once daily.  The IV iron that she received should help you recover your anemia.  Please notify Dr. Johnnye Sima office [phone] if you have any return of the bleeding  It is our hope to schedule a hysterectomy later this summer once your anemia is recovered.  Please do not resume drinking alcohol..  Is very important that your liver be in good shape if you are going to do surgery Below is some information on hysterectomy so that she can be planning. Abdominal Hysterectomy Abdominal hysterectomy is a surgical procedure to remove the womb (uterus). The uterus is the muscular organ that houses a developing baby. This surgery may be done if:  You have cancer.  You have growths (tumors or fibroids) in the uterus.  You have long-term (chronic) pain.  You are bleeding.  Your uterus has slipped down into your vagina (uterine prolapse).  You have a condition in which the tissue that lines the uterus grows outside of its normal location (endometriosis).  You have an infection in your uterus.  You are having problems with your menstrual cycle. Depending on why you are having this procedure, you may also have other reproductive organs removed. These could include:  The part of your vagina that connects with your uterus (cervix).  The organs that make eggs (ovaries).  The tubes that connect the ovaries to the uterus (fallopian tubes). Tell a health care provider about:  Any allergies you have.  All medicines you  are taking, including vitamins, herbs, eye drops, creams, and over-the-counter medicines.  Any problems you or family members have had with anesthetic medicines.  Any blood disorders you have.  Any surgeries you have had.  Any medical conditions you have.  Whether you are pregnant or may be pregnant. What are the risks? Generally, this is a safe procedure. However, problems may occur, including:  Bleeding.  Infection.  Allergic reactions to medicines or dyes.  Damage to other structures or organs.  Nerve injury.  Decreased interest in sex or pain during sex.  Blood clots that can break free and travel to your lungs. What happens before the procedure? Staying hydrated Follow instructions from your health care provider about hydration, which may include:  Up to 2 hours before the procedure - you may continue to drink clear liquids, such as water, clear fruit juice, black coffee, and plain tea Eating and drinking restrictions Follow instructions from your health care provider about eating and drinking, which may include:  8 hours before the procedure - stop eating heavy meals or foods such as meat, fried foods, or fatty foods.  6 hours before the procedure - stop eating light meals or foods, such as toast or cereal.  6 hours before the procedure - stop drinking milk or drinks that contain milk.  2 hours before the procedure - stop drinking clear liquids. Medicines  Ask your health care provider about: ? Changing or stopping your regular medicines. This is especially important if you are taking diabetes medicines or blood thinners. ?  Taking medicines such as aspirin and ibuprofen. These medicines can thin your blood. Do not take these medicines before your procedure if your health care provider instructs you not to.  You may be given antibiotic medicine to help prevent infection. Take it as told by your health care provider.  You may be asked to take laxatives to prevent  constipation. General instructions  Ask your health care provider how your surgical site will be marked or identified.  You may be asked to shower with a germ-killing soap.  Plan to have someone take you home from the hospital.  Do not use any products that contain nicotine or tobacco, such as cigarettes and e-cigarettes. If you need help quitting, ask your health care provider.  You may have an exam or testing.  You may have a blood or urine sample taken.  You may need to have an enema to clean out your rectum and lower colon.  This procedure can affect the way you feel about yourself. Talk to your health care provider about the physical and emotional changes this procedure may cause. What happens during the procedure?  To lower your risk of infection: ? Your health care team will wash or sanitize their hands. ? Your skin will be washed with soap. ? Hair may be removed from the surgical area.  An IV tube will be inserted into one of your veins.  You will be given one or more of the following: ? A medicine to help you relax (sedative). ? A medicine to make you fall asleep (general anesthetic).  Tight-fitting (compression) stockings will be placed on your legs to promote circulation.  A thin, flexible tube (catheter) will be inserted to help drain your urine.  The surgeon will make a cut (incision) through the skin in your lower belly. The incision may go side-to-side or up-and-down.  The surgeon will move aside the body tissue that covers your uterus. The surgeon will then carefully take out your uterus along with any of the other organs that need to be removed.  Bleeding will be controlled with clamps or sutures.  The surgeon will close your incision with stitches (sutures), skin glue, or adhesive strips.  A bandage (dressing) will be placed over the incision. The procedure may vary among health care providers and hospitals. What happens after the procedure?  You will  be given pain medicine as needed.  Your blood pressure, heart rate, breathing rate, and blood oxygen level will be monitored until the medicines you were given have worn off.  You will need to stay in the hospital to recover for one to two days. Ask your health care provider how long you will need to stay in the hospital after your procedure.  You may have a liquid diet at first. You will most likely return to your usual diet the day after surgery.  You will still have the urinary catheter in place. It will likely be removed the day after surgery.  You may have to wear compression stockings. These stockings help to prevent blood clots and reduce swelling in your legs.  You will be encouraged to walk as soon as possible. You will also use a device or do breathing exercises to keep your lungs clear.  You may need to use a sanitary napkin for vaginal discharge. Summary  Abdominal hysterectomy is a surgical procedure to remove the womb (uterus). The uterus is the muscular organ that houses a developing baby.  This procedure can affect the way  you feel about yourself. Talk to your health care provider about the physical and emotional changes this procedure may cause.  You will be given medicines for pain after the procedure.  You will need to stay in the hospital to recover. Ask your health care provider how long you will need to stay in the hospital after your procedure. This information is not intended to replace advice given to you by your health care provider. Make sure you discuss any questions you have with your health care provider. Document Released: 08/01/2013 Document Revised: 07/15/2016 Document Reviewed: 07/15/2016 Elsevier Interactive Patient Education  2019 [facility].   1)Follow up with Dr. Glo Herring the gynecologist as previously scheduled 2)Complete Abstinence from Alcohol advised--- please consider Alcohol Rehab program including AA meetings 3)Please take your  medications as prescribed 4)You Need to repeat CBC and BMP blood test within a week with your primary care physician or with Dr. Glo Herring
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
## 2019-02-27 ENCOUNTER — Telehealth: Payer: Self-pay | Admitting: Obstetrics and Gynecology

## 2019-02-27 NOTE — Telephone Encounter (Signed)

## 2019-02-28 ENCOUNTER — Other Ambulatory Visit: Payer: Self-pay

## 2019-02-28 ENCOUNTER — Encounter: Payer: Self-pay | Admitting: Obstetrics and Gynecology

## 2019-02-28 ENCOUNTER — Other Ambulatory Visit (HOSPITAL_COMMUNITY)
Admission: RE | Admit: 2019-02-28 | Discharge: 2019-02-28 | Disposition: A | Discharge status: Home / Self Care | Payer: Medicaid Other | Source: Ambulatory Visit | Attending: Obstetrics and Gynecology | Admitting: Obstetrics and Gynecology

## 2019-02-28 ENCOUNTER — Ambulatory Visit (INDEPENDENT_AMBULATORY_CARE_PROVIDER_SITE_OTHER): Payer: Medicaid Other | Admitting: Obstetrics and Gynecology

## 2019-02-28 VITALS — BP 132/85 | HR 85 | Ht 67.0 in | Wt 167.0 lb

## 2019-02-28 DIAGNOSIS — Z124 Encounter for screening for malignant neoplasm of cervix: Secondary | ICD-10-CM | POA: Insufficient documentation

## 2019-02-28 DIAGNOSIS — D500 Iron deficiency anemia secondary to blood loss (chronic): Secondary | ICD-10-CM | POA: Diagnosis not present

## 2019-02-28 LAB — POCT HEMOGLOBIN: Hemoglobin: 12.3 g/dL (ref 11–14.6)

## 2019-02-28 MED ORDER — CHLORDIAZEPOXIDE HCL 25 MG PO CAPS: ORAL_CAPSULE | ORAL | 0 refills | Status: DC
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
## 2019-03-01 LAB — CYTOLOGY - PAP
Diagnosis: NEGATIVE
HPV: NOT DETECTED

## 2019-03-08 ENCOUNTER — Other Ambulatory Visit: Payer: Self-pay | Admitting: Obstetrics and Gynecology

## 2019-03-08 DIAGNOSIS — N921 Excessive and frequent menstruation with irregular cycle: Secondary | ICD-10-CM

## 2019-03-08 DIAGNOSIS — D219 Benign neoplasm of connective and other soft tissue, unspecified: Secondary | ICD-10-CM

## 2019-03-09 ENCOUNTER — Telehealth: Payer: Self-pay | Admitting: Obstetrics and Gynecology

## 2019-03-09 NOTE — Telephone Encounter (Signed)

## 2019-03-10 ENCOUNTER — Other Ambulatory Visit: Payer: Medicaid Other

## 2019-03-20 ENCOUNTER — Telehealth: Payer: Self-pay | Admitting: Obstetrics & Gynecology

## 2019-03-20 NOTE — Telephone Encounter (Signed)

## 2019-03-21 ENCOUNTER — Ambulatory Visit (INDEPENDENT_AMBULATORY_CARE_PROVIDER_SITE_OTHER): Payer: Medicaid Other

## 2019-03-21 ENCOUNTER — Other Ambulatory Visit: Payer: Self-pay

## 2019-03-21 DIAGNOSIS — N921 Excessive and frequent menstruation with irregular cycle: Secondary | ICD-10-CM

## 2019-03-21 DIAGNOSIS — D219 Benign neoplasm of connective and other soft tissue, unspecified: Secondary | ICD-10-CM

## 2019-03-21 NOTE — Progress Notes (Signed)
PELVIC US TA/TV: enlarged heterogeneous anteverted uterus with mult.fibroids,(#1) anterior intramural vs submucosal fibroid 4.7 x 2.6 x 2.9 cm,(#2) posterior intramural fibroid 3.3 x 3.1 x 1.8 cm,(#3) fundal subserosal fibroid 2.9 x 2 x 2.7 cm,EEC 7.8 mm,endometrium is slightly distorted by the anterior fibroid,normal ovaries bilat,no free fluid,ovaries appear mobile,no pain during ultrasound,Dr.Ferguson please call pt with results.  Chaperone: Tish

## 2019-03-28 ENCOUNTER — Telehealth: Payer: Self-pay | Admitting: Obstetrics and Gynecology

## 2019-03-28 NOTE — Telephone Encounter (Signed)
Unable to reach pt to discuss bleeding , etoh use, or u/s report. No answer and mailbox full.

## 2019-03-29 ENCOUNTER — Telehealth: Payer: Self-pay | Admitting: Obstetrics and Gynecology

## 2019-03-29 NOTE — Telephone Encounter (Signed)
Pt is returning [place] call from yesterday.  Can you forward to him?

## 2019-03-31 ENCOUNTER — Telehealth: Payer: Self-pay | Admitting: *Deleted

## 2019-03-31 NOTE — Telephone Encounter (Signed)
Returned patient's call. Informed Dr Glo Herring would like to see her in the office.  Pt verbalized understanding and stated she was bleeding now and wanted to be seen.  Scheduled for Monday.

## 2019-04-03 ENCOUNTER — Other Ambulatory Visit: Payer: Self-pay

## 2019-04-03 ENCOUNTER — Encounter: Payer: Self-pay | Admitting: Obstetrics and Gynecology

## 2019-04-03 ENCOUNTER — Ambulatory Visit (INDEPENDENT_AMBULATORY_CARE_PROVIDER_SITE_OTHER): Payer: Medicaid Other | Admitting: Obstetrics and Gynecology

## 2019-04-03 VITALS — BP 150/92 | HR 102 | Ht 67.0 in | Wt 168.2 lb

## 2019-04-03 DIAGNOSIS — N921 Excessive and frequent menstruation with irregular cycle: Secondary | ICD-10-CM

## 2019-04-03 DIAGNOSIS — F4310 Post-traumatic stress disorder, unspecified: Secondary | ICD-10-CM | POA: Diagnosis not present

## 2019-04-03 MED ORDER — MEGESTROL ACETATE 40 MG PO TABS: 40.0000 mg | ORAL_TABLET | Freq: Every day | ORAL | 2 refills | Status: DC

## 2019-04-03 MED ORDER — ESCITALOPRAM OXALATE 10 MG PO TABS: 10.0000 mg | ORAL_TABLET | Freq: Every day | ORAL | 3 refills | Status: DC

## 2019-04-03 MED ORDER — CLONAZEPAM 1 MG PO TABS: 1.0000 mg | ORAL_TABLET | Freq: Two times a day (BID) | ORAL | 0 refills | Status: DC
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
## 2019-04-04 LAB — COMPREHENSIVE METABOLIC PANEL
ALT: 82 IU/L — ABNORMAL HIGH (ref 0–32)
AST: 112 IU/L — ABNORMAL HIGH (ref 0–40)
Albumin/Globulin Ratio: 1.5 (ref 1.2–2.2)
Albumin: 4.8 g/dL (ref 3.8–4.8)
Alkaline Phosphatase: 90 IU/L (ref 39–117)
BUN/Creatinine Ratio: 28 — ABNORMAL HIGH (ref 9–23)
BUN: 15 mg/dL (ref 6–24)
Bilirubin Total: 1.1 mg/dL (ref 0.0–1.2)
CO2: 25 mmol/L (ref 20–29)
Calcium: 9.6 mg/dL (ref 8.7–10.2)
Chloride: 97 mmol/L (ref 96–106)
Creatinine, Ser: 0.54 mg/dL — ABNORMAL LOW (ref 0.57–1.00)
GFR calc Af Amer: 133 mL/min/{1.73_m2} (ref >59)
GFR calc non Af Amer: 115 mL/min/{1.73_m2} (ref >59)
Globulin, Total: 3.2 g/dL (ref 1.5–4.5)
Glucose: 96 mg/dL (ref 65–99)
Potassium: 3.3 mmol/L — ABNORMAL LOW (ref 3.5–5.2)
Sodium: 140 mmol/L (ref 134–144)
Total Protein: 8 g/dL (ref 6.0–8.5)

## 2019-04-04 LAB — CBC WITH DIFFERENTIAL/PLATELET
Basophils Absolute: 0 10*3/uL (ref 0.0–0.2)
Basos: 0 %
EOS (ABSOLUTE): 0 10*3/uL (ref 0.0–0.4)
Eos: 0 %
Hematocrit: 33.1 % — ABNORMAL LOW (ref 34.0–46.6)
Hemoglobin: 10.4 g/dL — ABNORMAL LOW (ref 11.1–15.9)
Immature Grans (Abs): 0 10*3/uL (ref 0.0–0.1)
Immature Granulocytes: 0 %
Lymphocytes Absolute: 0.7 10*3/uL (ref 0.7–3.1)
Lymphs: 10 %
MCH: 24 pg — ABNORMAL LOW (ref 26.6–33.0)
MCHC: 31.4 g/dL — ABNORMAL LOW (ref 31.5–35.7)
MCV: 76 fL — ABNORMAL LOW (ref 79–97)
Monocytes Absolute: 0.5 10*3/uL (ref 0.1–0.9)
Monocytes: 7 %
Neutrophils Absolute: 6.3 10*3/uL (ref 1.4–7.0)
Neutrophils: 83 %
Platelets: 221 10*3/uL (ref 150–450)
RBC: 4.34 x10E6/uL (ref 3.77–5.28)
RDW: 15 % (ref 11.7–15.4)
WBC: 7.6 10*3/uL (ref 3.4–10.8)

## 2019-05-26 ENCOUNTER — Telehealth: Payer: Self-pay | Admitting: Obstetrics and Gynecology

## 2019-05-26 NOTE — Telephone Encounter (Signed)

## 2019-05-29 ENCOUNTER — Encounter: Payer: Medicaid Other | Admitting: Obstetrics and Gynecology

## 2019-05-31 ENCOUNTER — Encounter: Payer: Medicaid Other | Admitting: Obstetrics and Gynecology

## 2019-06-01 ENCOUNTER — Telehealth: Payer: Self-pay | Admitting: Obstetrics and Gynecology

## 2019-06-01 NOTE — Telephone Encounter (Signed)

## 2019-06-02 ENCOUNTER — Other Ambulatory Visit: Payer: Self-pay

## 2019-06-02 ENCOUNTER — Encounter: Payer: Self-pay | Admitting: Obstetrics and Gynecology

## 2019-06-02 ENCOUNTER — Other Ambulatory Visit: Payer: Self-pay | Admitting: Obstetrics and Gynecology

## 2019-06-02 ENCOUNTER — Ambulatory Visit (INDEPENDENT_AMBULATORY_CARE_PROVIDER_SITE_OTHER): Payer: Medicaid Other | Admitting: Obstetrics and Gynecology

## 2019-06-02 VITALS — BP 142/87 | HR 103 | Ht 67.0 in | Wt 169.0 lb

## 2019-06-02 DIAGNOSIS — N841 Polyp of cervix uteri: Secondary | ICD-10-CM | POA: Diagnosis not present

## 2019-06-02 DIAGNOSIS — N921 Excessive and frequent menstruation with irregular cycle: Secondary | ICD-10-CM | POA: Diagnosis not present

## 2019-06-02 DIAGNOSIS — F1010 Alcohol abuse, uncomplicated: Secondary | ICD-10-CM

## 2019-06-02 MED ORDER — MEGESTROL ACETATE 40 MG PO TABS: 40.0000 mg | ORAL_TABLET | Freq: Three times a day (TID) | ORAL | 2 refills | Status: DC

## 2019-06-02 NOTE — Addendum Note (Signed)
Addended by: Linton Rump on: 06/02/2019 10:56 AM   Modules accepted: Orders
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
## 2019-06-03 LAB — CBC
Hematocrit: 31.9 % — ABNORMAL LOW (ref 34.0–46.6)
Hemoglobin: 9.3 g/dL — ABNORMAL LOW (ref 11.1–15.9)
MCH: 20.4 pg — ABNORMAL LOW (ref 26.6–33.0)
MCHC: 29.2 g/dL — ABNORMAL LOW (ref 31.5–35.7)
MCV: 70 fL — ABNORMAL LOW (ref 79–97)
Platelets: 278 10*3/uL (ref 150–450)
RBC: 4.57 x10E6/uL (ref 3.77–5.28)
RDW: 15 % (ref 11.7–15.4)
WBC: 6.5 10*3/uL (ref 3.4–10.8)

## 2019-06-03 LAB — IRON AND TIBC
Iron Saturation: 3 % — CL (ref 15–55)
Iron: 12 ug/dL — ABNORMAL LOW (ref 27–159)
Total Iron Binding Capacity: 463 ug/dL — ABNORMAL HIGH (ref 250–450)
UIBC: 451 ug/dL — ABNORMAL HIGH (ref 131–425)

## 2019-06-03 LAB — COMPREHENSIVE METABOLIC PANEL
ALT: 36 IU/L — ABNORMAL HIGH (ref 0–32)
AST: 70 IU/L — ABNORMAL HIGH (ref 0–40)
Albumin/Globulin Ratio: 1.3 (ref 1.2–2.2)
Albumin: 4.4 g/dL (ref 3.8–4.8)
Alkaline Phosphatase: 88 IU/L (ref 39–117)
BUN/Creatinine Ratio: 18 (ref 9–23)
BUN: 11 mg/dL (ref 6–24)
Bilirubin Total: 0.4 mg/dL (ref 0.0–1.2)
CO2: 22 mmol/L (ref 20–29)
Calcium: 9.5 mg/dL (ref 8.7–10.2)
Chloride: 102 mmol/L (ref 96–106)
Creatinine, Ser: 0.6 mg/dL (ref 0.57–1.00)
GFR calc Af Amer: 128 mL/min/{1.73_m2} (ref >59)
GFR calc non Af Amer: 111 mL/min/{1.73_m2} (ref >59)
Globulin, Total: 3.5 g/dL (ref 1.5–4.5)
Glucose: 86 mg/dL (ref 65–99)
Potassium: 3.8 mmol/L (ref 3.5–5.2)
Sodium: 139 mmol/L (ref 134–144)
Total Protein: 7.9 g/dL (ref 6.0–8.5)

## 2019-06-03 LAB — VITAMIN B12: Vitamin B-12: 581 pg/mL (ref 232–1245)

## 2019-06-03 LAB — FOLATE: Folate: 3.8 ng/mL (ref >3.0)

## 2019-06-03 LAB — FERRITIN: Ferritin: 13 ng/mL — ABNORMAL LOW (ref 15–150)

## 2019-06-05 ENCOUNTER — Other Ambulatory Visit: Payer: Self-pay | Admitting: Obstetrics and Gynecology

## 2019-06-05 ENCOUNTER — Telehealth: Payer: Self-pay | Admitting: Obstetrics and Gynecology

## 2019-06-05 MED ORDER — FERUMOXYTOL INJECTION 510 MG/17 ML: 510.0000 mg | Freq: Once | INTRAVENOUS | 1 refills | Status: DC

## 2019-06-05 NOTE — Telephone Encounter (Signed)
Patient aware that she is to come to day surgery later this week and should receive a call from them to receive IV Feraheme 510 mg weekly x2 in preparation for abdominal hysterectomy bilateral salpingectomy late November or early December

## 2019-06-05 NOTE — Progress Notes (Signed)
Patient made aware of the scheduled Feraheme infusions

## 2019-06-07 ENCOUNTER — Encounter (HOSPITAL_COMMUNITY): Admission: RE | Admit: 2019-06-07 | Payer: Medicaid Other | Source: Ambulatory Visit

## 2019-06-09 ENCOUNTER — Encounter (HOSPITAL_COMMUNITY): Admission: RE | Admit: 2019-06-09 | Payer: Medicaid Other | Source: Ambulatory Visit

## 2019-06-12 ENCOUNTER — Encounter (HOSPITAL_COMMUNITY)
Admission: RE | Admit: 2019-06-12 | Discharge: 2019-06-12 | Disposition: A | Discharge status: Home / Self Care | Payer: Medicaid Other | Source: Ambulatory Visit | Attending: Obstetrics and Gynecology | Admitting: Obstetrics and Gynecology

## 2019-06-12 ENCOUNTER — Encounter (HOSPITAL_COMMUNITY): Payer: Self-pay

## 2019-06-12 ENCOUNTER — Other Ambulatory Visit: Payer: Self-pay

## 2019-06-12 DIAGNOSIS — D649 Anemia, unspecified: Secondary | ICD-10-CM | POA: Diagnosis present

## 2019-06-12 MED ORDER — SODIUM CHLORIDE 0.9 % IV SOLN
510.0000 mg | Freq: Once | INTRAVENOUS | Status: AC
  Administered 2019-06-12: 510 mg via INTRAVENOUS
  Filled 2019-06-12: qty 510

## 2019-06-12 MED ORDER — SODIUM CHLORIDE 0.9 % IV SOLN
Freq: Once | INTRAVENOUS | Status: AC
  Administered 2019-06-12: 13:00:00 via INTRAVENOUS

## 2019-06-14 ENCOUNTER — Encounter (HOSPITAL_COMMUNITY): Payer: Medicaid Other

## 2019-06-16 ENCOUNTER — Encounter (HOSPITAL_COMMUNITY): Admission: RE | Admit: 2019-06-16 | Payer: Medicaid Other | Source: Ambulatory Visit

## 2019-06-19 ENCOUNTER — Other Ambulatory Visit: Payer: Self-pay

## 2019-06-19 ENCOUNTER — Encounter (HOSPITAL_COMMUNITY): Payer: Self-pay

## 2019-06-19 ENCOUNTER — Encounter (HOSPITAL_COMMUNITY)
Admission: RE | Admit: 2019-06-19 | Discharge: 2019-06-19 | Disposition: A | Discharge status: Home / Self Care | Payer: Medicaid Other | Source: Ambulatory Visit | Attending: Obstetrics and Gynecology | Admitting: Obstetrics and Gynecology

## 2019-06-19 DIAGNOSIS — D649 Anemia, unspecified: Secondary | ICD-10-CM | POA: Diagnosis not present

## 2019-06-19 MED ORDER — SODIUM CHLORIDE 0.9 % IV SOLN
Freq: Once | INTRAVENOUS | Status: AC
  Administered 2019-06-19: 12:00:00 via INTRAVENOUS

## 2019-06-19 MED ORDER — SODIUM CHLORIDE 0.9 % IV SOLN
510.0000 mg | Freq: Once | INTRAVENOUS | Status: AC
  Administered 2019-06-19: 510 mg via INTRAVENOUS
  Filled 2019-06-19: qty 17

## 2019-07-11 ENCOUNTER — Telehealth: Payer: Self-pay | Admitting: Obstetrics and Gynecology

## 2019-07-11 NOTE — Telephone Encounter (Signed)
Tried to reach the patient to remind her of her appointment/restrictions, mailbox is full.

## 2019-07-12 ENCOUNTER — Encounter: Payer: Self-pay | Admitting: Obstetrics and Gynecology

## 2019-07-12 ENCOUNTER — Ambulatory Visit (INDEPENDENT_AMBULATORY_CARE_PROVIDER_SITE_OTHER): Payer: Medicaid Other | Admitting: Obstetrics and Gynecology

## 2019-07-12 ENCOUNTER — Other Ambulatory Visit: Payer: Self-pay

## 2019-07-12 VITALS — BP 138/83 | HR 96 | Ht 67.0 in | Wt 179.4 lb

## 2019-07-12 DIAGNOSIS — N939 Abnormal uterine and vaginal bleeding, unspecified: Secondary | ICD-10-CM

## 2019-07-12 MED ORDER — MEGESTROL ACETATE 40 MG PO TABS: 40.0000 mg | ORAL_TABLET | Freq: Three times a day (TID) | ORAL | 2 refills | Status: DC
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
## 2019-07-12 NOTE — Patient Instructions (Signed)
Brittney Melendez  07/12/2019     @PREFPERIOPPHARMACY @   Your procedure is scheduled on  07/18/2019.  [facility] to [facility]  Call this number if you have problems the morning of surgery:  [phone]   Remember:  Do not eat or drink after midnight.  Follow the enclosed diet and prep instructions.                      Take these medicines the morning of surgery with A SIP OF WATER  Clonazepam, lexapro.    Do not wear jewelry, make-up or nail polish.  Do not wear lotions, powders, or perfumes. Please wear deodorant and brush your teeth.  Do not shave 48 hours prior to surgery.  Men may shave face and neck.  Do not bring valuables to the hospital.  [facility] is not responsible for any belongings or valuables.  Contacts, dentures or bridgework may not be worn into surgery.  Leave your suitcase in the car.  After surgery it may be brought to your room.  For patients admitted to the hospital, discharge time will be determined by your treatment team.  Patients discharged the day of surgery will not be allowed to drive home.   Name and phone number of your driver:   family Special instructions:  None  Please read over the following fact sheets that you were given. Anesthesia Post-op Instructions and Care and Recovery After Surgery       Abdominal Hysterectomy, Care After This sheet gives you information about how to care for yourself after your procedure. Your health care provider may also give you more specific instructions. If you have problems or questions, contact your health care provider. What can I expect after the procedure? After the procedure, it is common to have:  Pain.  Tiredness (fatigue).  Poor appetite.  Lowered interest in sex.  Bleeding and discharge from your vagina. You may need to use a pad in your underwear after this procedure. Follow these instructions at home: Medicines  Take over-the-counter and prescription  medicines only as told by your doctor.  Do not take aspirin or ibuprofen. These medicines can cause bleeding.  Ask your doctor if the medicine prescribed to you: ? Requires you to avoid driving or using heavy machinery. ? Can cause trouble pooping (constipation). You may need to take these actions to prevent or treat trouble pooping:  Take over-the-counter or prescription medicines.  Eat foods that are high in fiber. These include beans, whole grains, and fresh fruits and vegetables.  Limit foods that are high in fat and processed sugars. These include fried or sweet foods. Surgical cut (incision) care      Follow instructions from your doctor about how to take care of your cut from surgery (incision). Make sure you: ? Wash your hands with soap and water before and after you change your bandage (dressing). If you cannot use soap and water, use hand sanitizer. ? Change your bandage as told by your doctor. ? Leave stitches (sutures), skin glue, or skin tape (adhesive) strips in place. They may need to stay in place for 2 weeks or longer. If tape strips get loose and curl up, you may trim the loose edges. Do not remove tape strips completely unless your doctor says it is okay.  Check your cut from surgery every day for signs of infection. Check for: ? Redness, swelling, or  pain. ? Fluid or blood. ? Warmth. ? Pus or a bad smell. Activity  Rest as told by your doctor. ? Do not sit for a long time without moving. Get up to take short walks every 1-2 hours. This is important. Ask for help if you feel weak or unsteady.  Do not lift anything that is heavier than 10 lb (4.5 kg), or the limit that you are told, until your doctor says that it is safe.  Do not drive or use heavy machinery while taking prescription pain medicine.  Follow your doctor's advice about exercise, driving, and general activities. Return to your normal activities as told by your doctor. Ask your doctor what activities  are safe for you. Lifestyle  Do not douche, use tampons, or have sex for at least 6 weeks or as told by your doctor.  Do not drink alcohol until your doctor says it is okay.  Do not use any products that contain nicotine or tobacco, such as cigarettes, e-cigarettes, and chewing tobacco. If you need help quitting, ask your doctor. General instructions   Drink enough fluid to keep your pee (urine) pale yellow.  Do not take baths, swim, or use a hot tub until your doctor approves. Ask your doctor if you may take showers. You may only be allowed to take sponge baths.  Try to have someone at home with you for the first 1-2 weeks to help you with your daily chores at home.  Keep the bandage dry until your doctor says it can be taken off.  Wear tight-fitting (compression) stockings as told by your health care provider. These stockings help to prevent blood clots and reduce swelling in your legs.  Keep all follow-up visits as told by your doctor. This is important. Contact a doctor if:  You have any of these signs of infection: ? Redness, swelling, or pain around your cut. ? Fluid or blood coming from your cut. ? Warmth coming from your cut. ? Pus or a bad smell coming from your cut. ? Chills or a fever.  Your cut breaks open.  You feel dizzy or light-headed.  You have pain or bleeding when you pee.  You keep having watery poop (diarrhea).  You keep feeling like you may vomit (nauseous) or keep vomiting.  You have unusual fluid (discharge) coming from your vagina.  You have a rash.  You have any type of reaction to your medicine that is not normal, or you develop an allergy to your medicine.  Your pain medicine does not help. Get help right away if:  You have a fever and your symptoms get worse all of a sudden.  You have very bad pain in your belly (abdomen).  You are short of breath.  You faint.  You have pain, swelling, or redness of your leg.  You bleed a lot  from your vagina and notice clumps of blood (clots). Summary  Do not take baths, swim, or use a hot tub until your doctor approves. Ask your doctor if you may take showers. You may only be allowed to take sponge baths.  Do not lift anything that is heavier than 10 lb (4.5 kg), or the limit that you are told, until your doctor says that it is safe.  Follow your doctor's advice about exercise, driving, and general activities. Ask your doctor what activities are safe for you.  Try to have someone at home with you for the first 1-2 weeks to help you with your  daily chores at home. This information is not intended to replace advice given to you by your health care provider. Make sure you discuss any questions you have with your health care provider. Document Released: 05/05/2008 Document Revised: 09/29/2018 Document Reviewed: 07/15/2016 Elsevier Patient Education  [place] After This sheet gives you information about how to care for yourself after your procedure. Your health care provider may also give you more specific instructions. If you have problems or questions, contact your health care provider. What can I expect after the procedure? After the procedure, it is common to have:  Pain in your abdomen.  Some light vaginal bleeding (spotting) for a few days.  Tiredness. Your recovery time will vary depending on which method your surgeon used for your surgery. Follow these instructions at home: Incision care   Follow instructions from your health care provider about how to take care of your incisions. Make sure you: ? Wash your hands with soap and water before and after you change your bandage (dressing). If soap and water are not available, use hand sanitizer. ? Change or remove your dressing as told by your health care provider. ? Leave any stitches (sutures), skin glue, or adhesive strips in place. These skin closures may need to stay in place for 2  weeks or longer. If adhesive strip edges start to loosen and curl up, you may trim the loose edges. Do not remove adhesive strips completely unless your health care provider tells you to do that.  Keep your dressing clean and dry.  Check your incision area every day for signs of infection. Check for: ? Redness, swelling, or pain that gets worse. ? Fluid or blood. ? Warmth. ? Pus or a bad smell. Activity  Rest as told by your health care provider.  Avoid sitting for a long time without moving. Get up to take short walks every 1-2 hours. This is important to improve blood flow and breathing. Ask for help if you feel weak or unsteady.  Return to your normal activities as told by your health care provider. Ask your health care provider what activities are safe for you.  Do not drive until your health care provider says that it is safe.  Do not lift anything that is heavier than 10 lb (4.5 kg), or the limit that you are told, until your health care provider says that it is safe. This may be 2-6 weeks depending on your surgery.  Until your health care provider approves: ? Do not douche. ? Do not use tampons. ? Do not have sex. Medicines  Take over-the-counter and prescription medicines only as told by your health care provider.  Ask your health care provider if the medicine prescribed to you: ? Requires you to avoid driving or using heavy machinery. ? Can cause constipation. You may need to take actions to prevent or treat constipation, such as:  Drink enough fluid to keep your urine pale yellow.  Take over-the-counter or prescription medicines.  Eat foods that are high in fiber, such as beans, whole grains, and fresh fruits and vegetables.  Limit foods that are high in fat and processed sugars, such as fried or sweet foods. General instructions  Wear compression stockings as told by your health care provider. These stockings help to prevent blood clots and reduce swelling in your  legs.  Do not use any products that contain nicotine or tobacco, such as cigarettes, e-cigarettes, and chewing tobacco. If you need help quitting,  ask your health care provider.  Do not take baths, swim, or use a hot tub until your health care provider approves. You may take showers.  Keep all follow-up visits as told by your health care provider. This is important. Contact a health care provider if you have:  Pain when you urinate.  Redness, swelling, or pain around an incision.  Fluid or blood coming from an incision.  Pus or a bad smell coming from an incision.  An incision that feels warm to the touch.  A fever.  Abdominal pain that gets worse or does not get better with medicine.  An incision that starts to break open.  A rash.  Light-headedness.  Nausea and vomiting. Get help right away if you:  Have pain in your chest or leg.  Develop shortness of breath.  Faint.  Have increased or heavy vaginal bleeding, such as soaking a pad in an hour. Summary  After the procedure, it is common to feel tired, have some pain in your abdomen, and have some light vaginal bleeding for a few days.  Follow instructions from your health care provider about how to take care of your incisions.  Return to your normal activities as told by your health care provider. Ask your health care provider what activities are safe for you.  Do not douche, use tampons, or have sex until your health care provider approves.  Keep all follow-up visits as told by your health care provider. This information is not intended to replace advice given to you by your health care provider. Make sure you discuss any questions you have with your health care provider. Document Released: 10/31/2010 Document Revised: 07/18/2018 Document Reviewed: 07/18/2018 Elsevier Patient Education  2020 [employer]. How to Use Chlorhexidine for Bathing Chlorhexidine gluconate (CHG) is a germ-killing (antiseptic) solution  that is used to clean the skin. It can get rid of the bacteria that normally live on the skin and can keep them away for about 24 hours. To clean your skin with CHG, you may be given:  A CHG solution to use in the shower or as part of a sponge bath.  A prepackaged cloth that contains CHG. Cleaning your skin with CHG may help lower the risk for infection:  While you are staying in the intensive care unit of the hospital.  If you have a vascular access, such as a central line, to provide short-term or long-term access to your veins.  If you have a catheter to drain urine from your bladder.  If you are on a ventilator. A ventilator is a machine that helps you breathe by moving air in and out of your lungs.  After surgery. What are the risks? Risks of using CHG include:  A skin reaction.  Hearing loss, if CHG gets in your ears.  Eye injury, if CHG gets in your eyes and is not rinsed out.  The CHG product catching fire. Make sure that you avoid smoking and flames after applying CHG to your skin. Do not use CHG:  If you have a chlorhexidine allergy or have previously reacted to chlorhexidine.  On babies younger than 85 months of age. How to use CHG solution  Use CHG only as told by your health care provider, and follow the instructions on the label.  Use the full amount of CHG as directed. Usually, this is one bottle. During a shower Follow these steps when using CHG solution during a shower (unless your health care provider gives you different  instructions): 1. Start the shower. 2. Use your normal soap and shampoo to wash your face and hair. 3. Turn off the shower or move out of the shower stream. 4. Pour the CHG onto a clean washcloth. Do not use any type of brush or rough-edged sponge. 5. Starting at your neck, lather your body down to your toes. Make sure you follow these instructions: ? If you will be having surgery, pay special attention to the part of your body where you  will be having surgery. Scrub this area for at least 1 minute. ? Do not use CHG on your head or face. If the solution gets into your ears or eyes, rinse them well with water. ? Avoid your genital area. ? Avoid any areas of skin that have broken skin, cuts, or scrapes. ? Scrub your back and under your arms. Make sure to wash skin folds. 6. Let the lather sit on your skin for 1-2 minutes or as long as told by your health care provider. 7. Thoroughly rinse your entire body in the shower. Make sure that all body creases and crevices are rinsed well. 8. Dry off with a clean towel. Do not put any substances on your body afterward--such as powder, lotion, or perfume--unless you are told to do so by your health care provider. Only use lotions that are recommended by the manufacturer. 9. Put on clean clothes or pajamas. 10. If it is the night before your surgery, sleep in clean sheets.  During a sponge bath Follow these steps when using CHG solution during a sponge bath (unless your health care provider gives you different instructions): 1. Use your normal soap and shampoo to wash your face and hair. 2. Pour the CHG onto a clean washcloth. 3. Starting at your neck, lather your body down to your toes. Make sure you follow these instructions: ? If you will be having surgery, pay special attention to the part of your body where you will be having surgery. Scrub this area for at least 1 minute. ? Do not use CHG on your head or face. If the solution gets into your ears or eyes, rinse them well with water. ? Avoid your genital area. ? Avoid any areas of skin that have broken skin, cuts, or scrapes. ? Scrub your back and under your arms. Make sure to wash skin folds. 4. Let the lather sit on your skin for 1-2 minutes or as long as told by your health care provider. 5. Using a different clean, wet washcloth, thoroughly rinse your entire body. Make sure that all body creases and crevices are rinsed well. 6. Dry  off with a clean towel. Do not put any substances on your body afterward--such as powder, lotion, or perfume--unless you are told to do so by your health care provider. Only use lotions that are recommended by the manufacturer. 7. Put on clean clothes or pajamas. 8. If it is the night before your surgery, sleep in clean sheets. How to use CHG prepackaged cloths  Only use CHG cloths as told by your health care provider, and follow the instructions on the label.  Use the CHG cloth on clean, dry skin.  Do not use the CHG cloth on your head or face unless your health care provider tells you to.  When washing with the CHG cloth: ? Avoid your genital area. ? Avoid any areas of skin that have broken skin, cuts, or scrapes. Before surgery Follow these steps when using a CHG cloth  to clean before surgery (unless your health care provider gives you different instructions): 1. Using the CHG cloth, vigorously scrub the part of your body where you will be having surgery. Scrub using a back-and-forth motion for 3 minutes. The area on your body should be completely wet with CHG when you are done scrubbing. 2. Do not rinse. Discard the cloth and let the area air-dry. Do not put any substances on the area afterward, such as powder, lotion, or perfume. 3. Put on clean clothes or pajamas. 4. If it is the night before your surgery, sleep in clean sheets.  For general bathing Follow these steps when using CHG cloths for general bathing (unless your health care provider gives you different instructions). 1. Use a separate CHG cloth for each area of your body. Make sure you wash between any folds of skin and between your fingers and toes. Wash your body in the following order, switching to a new cloth after each step: ? The front of your neck, shoulders, and chest. ? Both of your arms, under your arms, and your hands. ? Your stomach and groin area, avoiding the genitals. ? Your right leg and foot. ? Your left  leg and foot. ? The back of your neck, your back, and your buttocks. 2. Do not rinse. Discard the cloth and let the area air-dry. Do not put any substances on your body afterward--such as powder, lotion, or perfume--unless you are told to do so by your health care provider. Only use lotions that are recommended by the manufacturer. 3. Put on clean clothes or pajamas. Contact a health care provider if:  Your skin gets irritated after scrubbing.  You have questions about using your solution or cloth. Get help right away if:  Your eyes become very red or swollen.  Your eyes itch badly.  Your skin itches badly and is red or swollen.  Your hearing changes.  You have trouble seeing.  You have swelling or tingling in your mouth or throat.  You have trouble breathing.  You swallow any chlorhexidine. Summary  Chlorhexidine gluconate (CHG) is a germ-killing (antiseptic) solution that is used to clean the skin. Cleaning your skin with CHG may help to lower your risk for infection.  You may be given CHG to use for bathing. It may be in a bottle or in a prepackaged cloth to use on your skin. Carefully follow your health care provider's instructions and the instructions on the product label.  Do not use CHG if you have a chlorhexidine allergy.  Contact your health care provider if your skin gets irritated after scrubbing. This information is not intended to replace advice given to you by your health care provider. Make sure you discuss any questions you have with your health care provider. Document Released: 04/20/2012 Document Revised: 10/13/2018 Document Reviewed: 06/24/2017 Elsevier Patient Education  2020 [employer].

## 2019-07-13 ENCOUNTER — Other Ambulatory Visit: Payer: Self-pay | Admitting: Obstetrics and Gynecology

## 2019-07-13 MED ORDER — DEXTROSE 5 % IV SOLN: 900.0000 mg | Freq: Once | INTRAVENOUS | Status: DC

## 2019-07-13 MED ORDER — GENTAMICIN SULFATE 40 MG/ML IJ SOLN: 1.5000 mg/kg | Freq: Once | INTRAVENOUS | Status: DC
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
## 2019-07-14 ENCOUNTER — Encounter (HOSPITAL_COMMUNITY): Payer: Self-pay

## 2019-07-14 ENCOUNTER — Encounter (HOSPITAL_COMMUNITY)
Admission: RE | Admit: 2019-07-14 | Discharge: 2019-07-14 | Disposition: A | Discharge status: Home / Self Care | Payer: Medicaid Other | Source: Ambulatory Visit | Attending: Obstetrics and Gynecology | Admitting: Obstetrics and Gynecology

## 2019-07-14 ENCOUNTER — Other Ambulatory Visit: Payer: Self-pay

## 2019-07-14 ENCOUNTER — Other Ambulatory Visit (HOSPITAL_COMMUNITY)
Admission: RE | Admit: 2019-07-14 | Discharge: 2019-07-14 | Disposition: A | Discharge status: Home / Self Care | Payer: Medicaid Other | Source: Ambulatory Visit | Attending: Obstetrics and Gynecology | Admitting: Obstetrics and Gynecology

## 2019-07-14 DIAGNOSIS — Z01812 Encounter for preprocedural laboratory examination: Secondary | ICD-10-CM | POA: Insufficient documentation

## 2019-07-14 DIAGNOSIS — Z20828 Contact with and (suspected) exposure to other viral communicable diseases: Secondary | ICD-10-CM | POA: Diagnosis not present

## 2019-07-14 HISTORY — DX: Depression, unspecified: F32.A

## 2019-07-14 HISTORY — DX: Anxiety disorder, unspecified: F41.9

## 2019-07-14 LAB — CBC
HCT: 39 % (ref 36.0–46.0)
Hemoglobin: 12.2 g/dL (ref 12.0–15.0)
MCH: 25.2 pg — ABNORMAL LOW (ref 26.0–34.0)
MCHC: 31.3 g/dL (ref 30.0–36.0)
MCV: 80.6 fL (ref 80.0–100.0)
Platelets: 195 10*3/uL (ref 150–400)
RBC: 4.84 MIL/uL (ref 3.87–5.11)
WBC: 5.8 10*3/uL (ref 4.0–10.5)
nRBC: 0 % (ref 0.0–0.2)

## 2019-07-14 LAB — COMPREHENSIVE METABOLIC PANEL
ALT: 60 U/L — ABNORMAL HIGH (ref 0–44)
AST: 86 U/L — ABNORMAL HIGH (ref 15–41)
Albumin: 4.2 g/dL (ref 3.5–5.0)
Alkaline Phosphatase: 74 U/L (ref 38–126)
Anion gap: 12 (ref 5–15)
BUN: 11 mg/dL (ref 6–20)
CO2: 23 mmol/L (ref 22–32)
Calcium: 9 mg/dL (ref 8.9–10.3)
Chloride: 105 mmol/L (ref 98–111)
Creatinine, Ser: 0.45 mg/dL (ref 0.44–1.00)
GFR calc Af Amer: >60 mL/min (ref >60)
GFR calc non Af Amer: >60 mL/min (ref >60)
Glucose, Bld: 99 mg/dL (ref 70–99)
Potassium: 3.1 mmol/L — ABNORMAL LOW (ref 3.5–5.1)
Sodium: 140 mmol/L (ref 135–145)
Total Bilirubin: 0.6 mg/dL (ref 0.3–1.2)
Total Protein: 8.3 g/dL — ABNORMAL HIGH (ref 6.5–8.1)

## 2019-07-14 LAB — TYPE AND SCREEN
ABO/RH(D): A POS
Antibody Screen: NEGATIVE

## 2019-07-14 LAB — URINALYSIS, ROUTINE W REFLEX MICROSCOPIC
Bilirubin Urine: NEGATIVE
Glucose, UA: NEGATIVE mg/dL
Ketones, ur: NEGATIVE mg/dL
Leukocytes,Ua: NEGATIVE
Nitrite: NEGATIVE
Protein, ur: 100 mg/dL — AB
RBC / HPF: >50 RBC/hpf — ABNORMAL HIGH (ref 0–5)
Specific Gravity, Urine: 1.023 (ref 1.005–1.030)
pH: 6 (ref 5.0–8.0)

## 2019-07-14 LAB — HCG, SERUM, QUALITATIVE: Preg, Serum: NEGATIVE

## 2019-07-14 LAB — SARS CORONAVIRUS 2 (TAT 6-24 HRS): SARS Coronavirus 2: NEGATIVE

## 2019-07-18 ENCOUNTER — Inpatient Hospital Stay (HOSPITAL_COMMUNITY)
Admission: RE | Admit: 2019-07-18 | Discharge: 2019-07-21 | DRG: 743 | Disposition: A | Discharge status: Home / Self Care | Payer: Medicaid Other | Attending: Obstetrics and Gynecology | Admitting: Obstetrics and Gynecology

## 2019-07-18 ENCOUNTER — Other Ambulatory Visit: Payer: Self-pay

## 2019-07-18 ENCOUNTER — Inpatient Hospital Stay (HOSPITAL_COMMUNITY): Payer: Medicaid Other | Admitting: Anesthesiology

## 2019-07-18 ENCOUNTER — Encounter (HOSPITAL_COMMUNITY)
Admission: RE | Disposition: A | Discharge status: Home / Self Care | Payer: Self-pay | Source: Home / Self Care | Attending: Obstetrics and Gynecology

## 2019-07-18 ENCOUNTER — Encounter (HOSPITAL_COMMUNITY): Payer: Self-pay

## 2019-07-18 DIAGNOSIS — D500 Iron deficiency anemia secondary to blood loss (chronic): Secondary | ICD-10-CM | POA: Diagnosis present

## 2019-07-18 DIAGNOSIS — Z79899 Other long term (current) drug therapy: Secondary | ICD-10-CM | POA: Diagnosis not present

## 2019-07-18 DIAGNOSIS — N920 Excessive and frequent menstruation with regular cycle: Secondary | ICD-10-CM | POA: Diagnosis present

## 2019-07-18 DIAGNOSIS — D259 Leiomyoma of uterus, unspecified: Secondary | ICD-10-CM | POA: Diagnosis present

## 2019-07-18 DIAGNOSIS — F1010 Alcohol abuse, uncomplicated: Secondary | ICD-10-CM | POA: Diagnosis present

## 2019-07-18 DIAGNOSIS — Z20828 Contact with and (suspected) exposure to other viral communicable diseases: Secondary | ICD-10-CM | POA: Diagnosis present

## 2019-07-18 DIAGNOSIS — Z90711 Acquired absence of uterus with remaining cervical stump: Secondary | ICD-10-CM | POA: Diagnosis present

## 2019-07-18 HISTORY — PX: BILATERAL SALPINGECTOMY: SHX5743

## 2019-07-18 HISTORY — PX: SUPRACERVICAL ABDOMINAL HYSTERECTOMY: SHX5393

## 2019-07-18 LAB — COMPREHENSIVE METABOLIC PANEL
ALT: 52 U/L — ABNORMAL HIGH (ref 0–44)
AST: 78 U/L — ABNORMAL HIGH (ref 15–41)
Albumin: 4.4 g/dL (ref 3.5–5.0)
Alkaline Phosphatase: 79 U/L (ref 38–126)
Anion gap: 12 (ref 5–15)
BUN: 14 mg/dL (ref 6–20)
CO2: 27 mmol/L (ref 22–32)
Calcium: 9.2 mg/dL (ref 8.9–10.3)
Chloride: 100 mmol/L (ref 98–111)
Creatinine, Ser: 0.48 mg/dL (ref 0.44–1.00)
GFR calc Af Amer: >60 mL/min (ref >60)
GFR calc non Af Amer: >60 mL/min (ref >60)
Glucose, Bld: 110 mg/dL — ABNORMAL HIGH (ref 70–99)
Potassium: 3.7 mmol/L (ref 3.5–5.1)
Sodium: 139 mmol/L (ref 135–145)
Total Bilirubin: 0.9 mg/dL (ref 0.3–1.2)
Total Protein: 8.4 g/dL — ABNORMAL HIGH (ref 6.5–8.1)

## 2019-07-18 LAB — PROTIME-INR
INR: 1 (ref 0.8–1.2)
Prothrombin Time: 13.2 seconds (ref 11.4–15.2)

## 2019-07-18 LAB — APTT: aPTT: 32 seconds (ref 24–36)

## 2019-07-18 LAB — MAGNESIUM: Magnesium: 1.6 mg/dL — ABNORMAL LOW (ref 1.7–2.4)

## 2019-07-18 SURGERY — HYSTERECTOMY, SUPRACERVICAL, ABDOMINAL
Anesthesia: General | Site: Abdomen

## 2019-07-18 MED ORDER — BUPIVACAINE HCL (PF) 0.5 % IJ SOLN
INTRAMUSCULAR | Status: AC
  Filled 2019-07-18: qty 30

## 2019-07-18 MED ORDER — 0.9 % SODIUM CHLORIDE (POUR BTL) OPTIME
TOPICAL | Status: DC | PRN
  Administered 2019-07-18: 2000 mL

## 2019-07-18 MED ORDER — ONDANSETRON HCL 4 MG/2ML IJ SOLN: 4.0000 mg | Freq: Four times a day (QID) | INTRAMUSCULAR | Status: DC | PRN

## 2019-07-18 MED ORDER — PHENYLEPHRINE 40 MCG/ML (10ML) SYRINGE FOR IV PUSH (FOR BLOOD PRESSURE SUPPORT)
PREFILLED_SYRINGE | INTRAVENOUS | Status: AC
  Filled 2019-07-18: qty 10

## 2019-07-18 MED ORDER — SODIUM CHLORIDE 0.9% FLUSH: 9.0000 mL | INTRAVENOUS | Status: DC | PRN

## 2019-07-18 MED ORDER — ONDANSETRON HCL 4 MG PO TABS: 4.0000 mg | ORAL_TABLET | Freq: Four times a day (QID) | ORAL | Status: DC | PRN

## 2019-07-18 MED ORDER — LIDOCAINE HCL (CARDIAC) PF 50 MG/5ML IV SOSY
PREFILLED_SYRINGE | INTRAVENOUS | Status: DC | PRN
  Administered 2019-07-18: 60 mg via INTRAVENOUS

## 2019-07-18 MED ORDER — MIDAZOLAM HCL 2 MG/2ML IJ SOLN
INTRAMUSCULAR | Status: AC
  Filled 2019-07-18: qty 2

## 2019-07-18 MED ORDER — KETOROLAC TROMETHAMINE 30 MG/ML IJ SOLN
30.0000 mg | Freq: Once | INTRAMUSCULAR | Status: AC
  Administered 2019-07-18: 30 mg via INTRAVENOUS
  Filled 2019-07-18: qty 1

## 2019-07-18 MED ORDER — HYDROMORPHONE 1 MG/ML IV SOLN
INTRAVENOUS | Status: AC
  Filled 2019-07-18: qty 30

## 2019-07-18 MED ORDER — DIPHENHYDRAMINE HCL 50 MG/ML IJ SOLN: 12.5000 mg | Freq: Four times a day (QID) | INTRAMUSCULAR | Status: DC | PRN

## 2019-07-18 MED ORDER — GENTAMICIN SULFATE 40 MG/ML IJ SOLN
5.0000 mg/kg | Freq: Once | INTRAVENOUS | Status: AC
  Administered 2019-07-18: 11:00:00 410 mg via INTRAVENOUS
  Filled 2019-07-18: qty 10.25

## 2019-07-18 MED ORDER — BUPIVACAINE HCL (PF) 0.5 % IJ SOLN
INTRAMUSCULAR | Status: DC | PRN
  Administered 2019-07-18: 30 mL

## 2019-07-18 MED ORDER — SODIUM CHLORIDE (PF) 0.9 % IJ SOLN
INTRAMUSCULAR | Status: AC
  Filled 2019-07-18: qty 10

## 2019-07-18 MED ORDER — ESMOLOL HCL 100 MG/10ML IV SOLN
INTRAVENOUS | Status: DC | PRN
  Administered 2019-07-18: 20 mg via INTRAVENOUS

## 2019-07-18 MED ORDER — PROPOFOL 10 MG/ML IV BOLUS
INTRAVENOUS | Status: DC | PRN
  Administered 2019-07-18: 200 mg via INTRAVENOUS

## 2019-07-18 MED ORDER — SUGAMMADEX SODIUM 200 MG/2ML IV SOLN
INTRAVENOUS | Status: DC | PRN
  Administered 2019-07-18: 200 mg via INTRAVENOUS

## 2019-07-18 MED ORDER — MIDAZOLAM HCL 5 MG/5ML IJ SOLN
INTRAMUSCULAR | Status: DC | PRN
  Administered 2019-07-18 (×2): 2 mg via INTRAVENOUS

## 2019-07-18 MED ORDER — PANTOPRAZOLE SODIUM 40 MG PO TBEC
40.0000 mg | DELAYED_RELEASE_TABLET | Freq: Every day | ORAL | Status: DC
  Administered 2019-07-19 – 2019-07-21 (×3): 40 mg via ORAL
  Filled 2019-07-18 (×4): qty 1

## 2019-07-18 MED ORDER — OXYCODONE-ACETAMINOPHEN 5-325 MG PO TABS
1.0000 | ORAL_TABLET | Freq: Four times a day (QID) | ORAL | Status: DC | PRN
  Administered 2019-07-20: 1 via ORAL
  Administered 2019-07-20: 2 via ORAL
  Administered 2019-07-20 – 2019-07-21 (×2): 1 via ORAL
  Filled 2019-07-18: qty 1
  Filled 2019-07-18: qty 2
  Filled 2019-07-18 (×3): qty 1

## 2019-07-18 MED ORDER — PHENYLEPHRINE HCL (PRESSORS) 10 MG/ML IV SOLN
INTRAVENOUS | Status: DC | PRN
  Administered 2019-07-18: 40 ug via INTRAVENOUS
  Administered 2019-07-18: 80 ug via INTRAVENOUS

## 2019-07-18 MED ORDER — HYDROMORPHONE HCL 1 MG/ML IJ SOLN
INTRAMUSCULAR | Status: AC
  Filled 2019-07-18: qty 1

## 2019-07-18 MED ORDER — ROCURONIUM 10MG/ML (10ML) SYRINGE FOR MEDFUSION PUMP - OPTIME
INTRAVENOUS | Status: DC | PRN
  Administered 2019-07-18 (×2): 10 mg via INTRAVENOUS
  Administered 2019-07-18: 50 mg via INTRAVENOUS

## 2019-07-18 MED ORDER — PROMETHAZINE HCL 25 MG/ML IJ SOLN: 6.2500 mg | INTRAMUSCULAR | Status: DC | PRN

## 2019-07-18 MED ORDER — DIPHENHYDRAMINE HCL 12.5 MG/5ML PO ELIX: 12.5000 mg | ORAL_SOLUTION | Freq: Four times a day (QID) | ORAL | Status: DC | PRN

## 2019-07-18 MED ORDER — SUFENTANIL CITRATE 50 MCG/ML IV SOLN
INTRAVENOUS | Status: DC | PRN
  Administered 2019-07-18 (×3): 10 ug via INTRAVENOUS

## 2019-07-18 MED ORDER — FENTANYL CITRATE (PF) 250 MCG/5ML IJ SOLN
INTRAMUSCULAR | Status: AC
  Filled 2019-07-18: qty 5

## 2019-07-18 MED ORDER — ROCURONIUM BROMIDE 10 MG/ML (PF) SYRINGE
PREFILLED_SYRINGE | INTRAVENOUS | Status: AC
  Filled 2019-07-18: qty 10

## 2019-07-18 MED ORDER — ENOXAPARIN SODIUM 40 MG/0.4ML ~~LOC~~ SOLN
40.0000 mg | SUBCUTANEOUS | Status: DC
  Administered 2019-07-19 – 2019-07-20 (×2): 40 mg via SUBCUTANEOUS
  Filled 2019-07-18 (×3): qty 0.4

## 2019-07-18 MED ORDER — KCL IN DEXTROSE-NACL 10-5-0.45 MEQ/L-%-% IV SOLN
INTRAVENOUS | Status: DC
  Administered 2019-07-18 – 2019-07-19 (×4): via INTRAVENOUS
  Filled 2019-07-18 (×8): qty 1000

## 2019-07-18 MED ORDER — CHLORHEXIDINE GLUCONATE CLOTH 2 % EX PADS
6.0000 | MEDICATED_PAD | Freq: Every day | CUTANEOUS | Status: DC
  Administered 2019-07-18 – 2019-07-19 (×2): 6 via TOPICAL

## 2019-07-18 MED ORDER — NALOXONE HCL 0.4 MG/ML IJ SOLN: 0.4000 mg | INTRAMUSCULAR | Status: DC | PRN

## 2019-07-18 MED ORDER — LIDOCAINE 2% (20 MG/ML) 5 ML SYRINGE
INTRAMUSCULAR | Status: AC
  Filled 2019-07-18: qty 5

## 2019-07-18 MED ORDER — SUFENTANIL CITRATE 50 MCG/ML IV SOLN
INTRAVENOUS | Status: AC
  Filled 2019-07-18: qty 1

## 2019-07-18 MED ORDER — KETAMINE HCL 50 MG/5ML IJ SOSY
PREFILLED_SYRINGE | INTRAMUSCULAR | Status: AC
  Filled 2019-07-18: qty 5

## 2019-07-18 MED ORDER — LORAZEPAM 1 MG PO TABS: 1.0000 mg | ORAL_TABLET | Freq: Four times a day (QID) | ORAL | Status: DC | PRN

## 2019-07-18 MED ORDER — FENTANYL CITRATE (PF) 100 MCG/2ML IJ SOLN
INTRAMUSCULAR | Status: DC | PRN
  Administered 2019-07-18: 50 ug via INTRAVENOUS
  Administered 2019-07-18: 100 ug via INTRAVENOUS
  Administered 2019-07-18: 50 ug via INTRAVENOUS
  Administered 2019-07-18: 150 ug via INTRAVENOUS
  Administered 2019-07-18: 100 ug via INTRAVENOUS
  Administered 2019-07-18: 50 ug via INTRAVENOUS

## 2019-07-18 MED ORDER — CLINDAMYCIN PHOSPHATE 900 MG/50ML IV SOLN
INTRAVENOUS | Status: AC
  Filled 2019-07-18: qty 50

## 2019-07-18 MED ORDER — KETOROLAC TROMETHAMINE 15 MG/ML IJ SOLN
15.0000 mg | Freq: Four times a day (QID) | INTRAMUSCULAR | Status: DC
  Administered 2019-07-18 – 2019-07-20 (×6): 15 mg via INTRAVENOUS
  Filled 2019-07-18 (×6): qty 1

## 2019-07-18 MED ORDER — CLONAZEPAM 0.5 MG PO TABS
1.0000 mg | ORAL_TABLET | Freq: Two times a day (BID) | ORAL | Status: DC
  Administered 2019-07-18 – 2019-07-21 (×6): 1 mg via ORAL
  Filled 2019-07-18 (×6): qty 2

## 2019-07-18 MED ORDER — KETAMINE HCL 10 MG/ML IJ SOLN
INTRAMUSCULAR | Status: DC | PRN
  Administered 2019-07-18 (×2): 10 mg via INTRAVENOUS

## 2019-07-18 MED ORDER — LACTATED RINGERS IV SOLN
Freq: Once | INTRAVENOUS | Status: AC
  Administered 2019-07-18: 10:00:00 via INTRAVENOUS

## 2019-07-18 MED ORDER — HYDROMORPHONE HCL 1 MG/ML IJ SOLN
0.2500 mg | INTRAMUSCULAR | Status: DC | PRN
  Administered 2019-07-18: 0.5 mg via INTRAVENOUS
  Filled 2019-07-18: qty 0.5

## 2019-07-18 MED ORDER — LACTATED RINGERS IV SOLN
INTRAVENOUS | Status: DC | PRN
  Administered 2019-07-18 (×4): via INTRAVENOUS

## 2019-07-18 MED ORDER — THIAMINE HCL 100 MG/ML IJ SOLN
100.0000 mg | Freq: Every day | INTRAMUSCULAR | Status: DC
  Administered 2019-07-18 – 2019-07-19 (×2): 100 mg via INTRAVENOUS
  Filled 2019-07-18 (×2): qty 2

## 2019-07-18 MED ORDER — HYDROMORPHONE 1 MG/ML IV SOLN
INTRAVENOUS | Status: DC
  Administered 2019-07-18: 17:00:00 via INTRAVENOUS
  Administered 2019-07-20: 2.1 mL via INTRAVENOUS
  Filled 2019-07-18: qty 30

## 2019-07-18 MED ORDER — ARTIFICIAL TEARS OPHTHALMIC OINT
TOPICAL_OINTMENT | OPHTHALMIC | Status: AC
  Filled 2019-07-18: qty 3.5

## 2019-07-18 MED ORDER — ESMOLOL HCL 100 MG/10ML IV SOLN
INTRAVENOUS | Status: AC
  Filled 2019-07-18: qty 20

## 2019-07-18 MED ORDER — CLINDAMYCIN PHOSPHATE 900 MG/50ML IV SOLN
900.0000 mg | Freq: Once | INTRAVENOUS | Status: AC
  Administered 2019-07-18: 10:00:00 900 mg via INTRAVENOUS

## 2019-07-18 MED ORDER — GENTAMICIN SULFATE 40 MG/ML IJ SOLN
INTRAVENOUS | Status: DC | PRN
  Administered 2019-07-18: 11:00:00 405.2 mg via INTRAVENOUS

## 2019-07-18 MED ORDER — ESCITALOPRAM OXALATE 10 MG PO TABS
10.0000 mg | ORAL_TABLET | Freq: Every day | ORAL | Status: DC
  Administered 2019-07-18 – 2019-07-21 (×4): 10 mg via ORAL
  Filled 2019-07-18 (×4): qty 1

## 2019-07-18 MED ORDER — MEPERIDINE HCL 50 MG/ML IJ SOLN: 6.2500 mg | INTRAMUSCULAR | Status: DC | PRN

## 2019-07-18 SURGICAL SUPPLY — 47 items
APPLIER CLIP 13 LRG OPEN (CLIP)
BENZOIN TINCTURE PRP APPL 2/3 (GAUZE/BANDAGES/DRESSINGS) ×3 IMPLANT
BLADE SURG SZ10 CARB STEEL (BLADE) ×3 IMPLANT
CLIP APPLIE 13 LRG OPEN (CLIP) IMPLANT
CLOTH BEACON ORANGE TIMEOUT ST (SAFETY) ×3 IMPLANT
COVER LIGHT HANDLE STERIS (MISCELLANEOUS) ×6 IMPLANT
COVER WAND RF STERILE (DRAPES) ×3 IMPLANT
DECANTER SPIKE VIAL GLASS SM (MISCELLANEOUS) ×3 IMPLANT
DRAPE WARM FLUID 44X44 (DRAPES) ×3 IMPLANT
DRSG OPSITE POSTOP 4X10 (GAUZE/BANDAGES/DRESSINGS) ×3 IMPLANT
DURAPREP 26ML APPLICATOR (WOUND CARE) ×3 IMPLANT
ELECT REM PT RETURN 9FT ADLT (ELECTROSURGICAL) ×3
ELECTRODE REM PT RTRN 9FT ADLT (ELECTROSURGICAL) ×2 IMPLANT
GAUZE SPONGE 4X4 16PLY XRAY LF (GAUZE/BANDAGES/DRESSINGS) ×3 IMPLANT
GLOVE BIO SURGEON STRL SZ7 (GLOVE) ×3 IMPLANT
GLOVE BIOGEL PI IND STRL 7.0 (GLOVE) ×8 IMPLANT
GLOVE BIOGEL PI IND STRL 9 (GLOVE) ×2 IMPLANT
GLOVE BIOGEL PI INDICATOR 7.0 (GLOVE) ×4
GLOVE BIOGEL PI INDICATOR 9 (GLOVE) ×1
GLOVE ECLIPSE 6.5 STRL STRAW (GLOVE) ×3 IMPLANT
GLOVE ECLIPSE 9.0 STRL (GLOVE) ×3 IMPLANT
GOWN SPEC L3 XXLG W/TWL (GOWN DISPOSABLE) ×6 IMPLANT
GOWN STRL REUS W/TWL LRG LVL3 (GOWN DISPOSABLE) ×6 IMPLANT
INST SET MAJOR GENERAL (KITS) ×3 IMPLANT
KIT TURNOVER KIT A (KITS) ×3 IMPLANT
MANIFOLD NEPTUNE II (INSTRUMENTS) ×3 IMPLANT
NEEDLE HYPO 25X1 1.5 SAFETY (NEEDLE) ×3 IMPLANT
NS IRRIG 1000ML POUR BTL (IV SOLUTION) ×6 IMPLANT
PACK ABDOMINAL MAJOR (CUSTOM PROCEDURE TRAY) ×3 IMPLANT
PAD ARMBOARD 7.5X6 YLW CONV (MISCELLANEOUS) ×3 IMPLANT
RETRACTOR WND ALEXIS 25 LRG (MISCELLANEOUS) ×2 IMPLANT
RTRCTR WOUND ALEXIS 25CM LRG (MISCELLANEOUS) ×3
SET BASIN LINEN APH (SET/KITS/TRAYS/PACK) ×3 IMPLANT
SPONGE LAP 18X18 RF (DISPOSABLE) ×6 IMPLANT
STRIP CLOSURE SKIN 1/2X4 (GAUZE/BANDAGES/DRESSINGS) ×3 IMPLANT
SUT CHROMIC 0 CT 1 (SUTURE) ×33 IMPLANT
SUT CHROMIC 2 0 CT 1 (SUTURE) ×9 IMPLANT
SUT PLAIN CT 1/2CIR 2-0 27IN (SUTURE) ×6 IMPLANT
SUT VIC AB 0 CT1 27 (SUTURE) ×2
SUT VIC AB 0 CT1 27XBRD ANTBC (SUTURE) ×2 IMPLANT
SUT VIC AB 0 CT1 27XCR 8 STRN (SUTURE) ×2 IMPLANT
SUT VICRYL 4 0 KS 27 (SUTURE) ×6 IMPLANT
SYR BULB IRRIGATION 50ML (SYRINGE) ×3 IMPLANT
SYR CONTROL 10ML LL (SYRINGE) ×3 IMPLANT
TOWEL OR 17X26 4PK STRL BLUE (TOWEL DISPOSABLE) ×3 IMPLANT
TOWEL SURG RFD BLUE STRL DISP (DISPOSABLE) ×3 IMPLANT
TRAY FOLEY MTR SLVR 16FR STAT (SET/KITS/TRAYS/PACK) ×3 IMPLANT

## 2019-07-18 NOTE — Anesthesia Procedure Notes (Signed)
Procedure Name: Intubation Date/Time: 07/18/2019 10:22 AM Performed by: Ollen Bowl, CRNA Pre-anesthesia Checklist: Patient identified, Patient being monitored, Timeout performed, Emergency Drugs available and Suction available Patient Re-evaluated:Patient Re-evaluated prior to induction Oxygen Delivery Method: Circle system utilized Preoxygenation: Pre-oxygenation with 100% oxygen Induction Type: IV induction Ventilation: Mask ventilation without difficulty Laryngoscope Size: Mac, 3 and 4 Grade View: Grade II Tube type: Oral Tube size: 7.0 mm Number of attempts: 2 Airway Equipment and Method: Stylet Placement Confirmation: ETT inserted through vocal cords under direct vision,  positive ETCO2 and breath sounds checked- equal and bilateral Secured at: 23 cm Tube secured with: Tape Dental Injury: Teeth and Oropharynx as per pre-operative assessment  Comments: Trachea deep, poor view with Mac 3, grade two view with Mac 4.

## 2019-07-18 NOTE — Op Note (Signed)
07/18/2019  12:26 PM  PATIENT:  Brittney Melendez  43 y.o. female  PRE-OPERATIVE DIAGNOSIS:  Uterine fibroids, bleeding with anemia  POST-OPERATIVE DIAGNOSIS:  Uterine fibroids, bleeding with anemia  PROCEDURE:  Procedure(s): HYSTERECTOMY SUPRACERVICAL ABDOMINAL (N/A) OPEN BILATERAL SALPINGECTOMY (Bilateral)  SURGEON:  Surgeon(s) and Role:    [name], MD - Primary  PHYSICIAN ASSISTANT:   ASSISTANTS: Corrie Dandy, RNFA  ANESTHESIA:   local and general  EBL:  150 mL   BLOOD ADMINISTERED:none  DRAINS: Urinary Catheter (Foley)   LOCAL MEDICATIONS USED:  MARCAINE    and Amount: 30 ml  SPECIMEN:  Source of Specimen:  Uterus, bilateral fallopian tubes  DISPOSITION OF SPECIMEN:  PATHOLOGY  COUNTS:  YES  TOURNIQUET:  * No tourniquets in log *  DICTATION: .Dragon Dictation  PLAN OF CARE: Admit to inpatient   PATIENT DISPOSITION:  PACU - hemodynamically stable.   Delay start of Pharmacological VTE agent (>24hrs) due to surgical blood loss or risk of bleeding: yes Details of procedure patient was taken the operating room prepped and draped for lower abdominal transverse incision surgery.  Timeout was conducted.  Foley catheter was in place.  Gentamicin and Cleocin were given as antibiotic prophylaxis.  The transverse lower abdominal incision was made, with excision of a 3 cm ellipse of redundant skin and fatty tissue to allow for better visualization of the pelvis.  The fascia was opened transversely, the peritoneal cavity entered carefully in the midline with no adhesions below the umbilicus.  The omentum was adherent to the upper abdomen incisions. The large Alexis retractor was positioned in place and the bowel packed away using moistened lap tapes followed by placement of a rolled blue towel, allowing adequate visualization.  Attention was directed to the large soft boggy uterus.  The left round ligament could be taken down and the utero-ovarian ligament exposed.   This was doubly crossclamped with Kelly clamps transected and ligated on each side.  The bladder flap was developed on the left one half of the lower uterine segment.  The uterine vessels on the side were identified were clamped with a curved Heaney clamp x2 along with a Kelly clamp for backbleeding.  The uterine vessels were then transected, and the vessels doubly ligated using 0 chromic once and then 0 Vicryl for good hemostasis and then the backbleeding areas on the uterus were oversewn with figure-of-eight sutures of 0 Vicryl.  Attention was then directed to the opposite side.  On the side the round ligament was similarly taken down, the fallopian tube and utero-ovarian ligaments isolated doubly clamped transected and ligated proximally and distally.  Uterine vessels on this side were then exposed, bladder flap developed.  Doubly clamping with curved Heaney clamps were performed Kelly clamp for backbleeding the uterine vessels transected ligated and confirmed as hemostatic.  The upper cardinal ligament on the side was then clamped with a straight Haney, knife dissection performed and the pedicle ligated with 0 Vicryl.  The opposite upper cardinal ligaments were similarly clamped cut and suture-ligated, at this time we could amputate off the uterine body off of the lower uterine segment to allow improved visualization.  At this point a  Lahey thyroid tenaculum could be placed on the stump of the lower uterine segment, and then the cervix inspected and palpated.  We were approximately 2 cm of cervix left in place.  Was able to core out the upper half of the cervix to allow for the cone-shaped coring of the lower  uterine segment, then the top of the cervical stump was oversewn front to back with a series of interrupted 0 chromic sutures hemostasis was satisfactory.  Tubal removal was then performed on each side identifying the fallopian tube on each side both which were quite large, and elevating them and  grasping across the mesosalpinx with Kelly clamp and then transecting the fallopian tube off the mesosalpinx and tying the pedicle.  On the left side it was a single pedicle and on the right side it was 2 separate pedicles to avoid bunching of the tissues. The pedicles were inspected irrigated inspected for adequacy of hemostasis and considered satisfactory.  The area under the bladder flap was then tacked lightly just in front of the oversewn cervical stump to allow proper positioning of the bladder flap to minimize the area that adhesions could occur.  Hemostasis again confirmed.  The pelvis was irrigated x3 and remained hemostatic upon careful inspection. Laparotomy equipment was removed, sponge and needle counts correct, anterior peritoneum closed with a running 2-0 Vicryl, the fascia closed with running 0 Vicryl, the fascia then injected with 20 cc of Marcaine, then the subcutaneous tissues irrigated, then approximated with interrupted horizontal mattress sutures of 2-0 plain x5 sites followed by subcuticular 4-0 Vicryl closure of the skin.  Sponge and needle counts were correct throughout patient tolerated procedure well and went to recovery room in apparent stable condition.

## 2019-07-18 NOTE — Transfer of Care (Signed)
Immediate Anesthesia Transfer of Care Note  Patient: Brittney Melendez  Procedure(s) Performed: HYSTERECTOMY SUPRACERVICAL ABDOMINAL (N/A Abdomen) OPEN BILATERAL SALPINGECTOMY (Bilateral Abdomen)  Patient Location: PACU  Anesthesia Type:General  Level of Consciousness: awake  Airway & Oxygen Therapy: Patient Spontanous Breathing and Patient connected to face mask oxygen  Post-op Assessment: Report given to RN  Post vital signs: Reviewed and stable  Last Vitals:  Vitals Value Taken Time  BP 158/87 07/18/19 1245  Temp 37.2 C 07/18/19 1245  Pulse 104 07/18/19 1249  Resp 21 07/18/19 1249  SpO2 100 % 07/18/19 1249  Vitals shown include unvalidated device data.  Last Pain:  Vitals:   07/18/19 1245  TempSrc:   PainSc: (P) Asleep      Patients Stated Pain Goal: 5 ([id number])  Complications: No apparent anesthesia complications

## 2019-07-18 NOTE — Progress Notes (Signed)
Day of Surgery Procedure(s) (LRB): HYSTERECTOMY SUPRACERVICAL ABDOMINAL (N/A) OPEN BILATERAL SALPINGECTOMY (Bilateral)  Subjective: Patient reports incisional pain.  Rates it at a 6 of 10. She also states she has been drinking regularly, til 2 nights ago, and she does at times experience jitters. She has been placed on Thiamine 100 mg iv qd, and will add ativan 1mg  po q6h. Objective: I have reviewed patient's vital signs and labs. BP 137/84 (BP Location: Right Arm)   Pulse 96   Temp 98.9 F (37.2 C) (Oral)   Resp 18   Ht 5\' 7"  (1.702 m)   Wt 81 kg   LMP 07/18/2019 Comment: on at present  SpO2 99%   BMI 27.97 kg/m   General: alert, appears stated age and mild distress GI: incision: clean, dry and intact Vaginal Bleeding: none  Assessment: s/p Procedure(s): HYSTERECTOMY SUPRACERVICAL ABDOMINAL (N/A) OPEN BILATERAL SALPINGECTOMY (Bilateral): stable At risk for alcohol withdrawal Plan: Advance diet add ativan  Continue Thiamine 100 mg  Add toradol 15 mg 15 q6h  LOS: 0 days    Brittney Melendez 07/18/2019, 9:00 PM

## 2019-07-18 NOTE — Interval H&P Note (Signed)
History and Physical Interval Note:  07/18/2019 9:59 AM  Brittney Melendez  has presented today for surgery, with the diagnosis of Uterine fibroids, bleeding with anemia.  The various methods of treatment have been discussed with the patient and family. After consideration of risks, benefits and other options for treatment, the patient has consented to  Procedure(s): HYSTERECTOMY SUPRACERVICAL ABDOMINAL (N/A) OPEN BILATERAL SALPINGECTOMY (Bilateral) as a surgical intervention.  The patient's history has been reviewed, patient examined, no change in status, stable for surgery.  I have reviewed the patient's chart and labs.  Questions were answered to the patient's satisfaction.   She has acknowledged that she has resumed drinking, with her last alcohol 2 days ago, consisting of 3 mixed drinks.   Pt is aware that her liver function tests are 1.3 times upper limits of normal, and may increase with anesthesia . After discussion of risk, benefit, and challenges , she desires to proceed with the surgery. She is aware that delaying the surgery is an option , which she declines.   LFT results discussed with Anesthesia. Coags normal.    [name]
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
## 2019-07-18 NOTE — Anesthesia Postprocedure Evaluation (Signed)
Anesthesia Post Note  Patient: Sharrell L Arroyo  Procedure(s) Performed: HYSTERECTOMY SUPRACERVICAL ABDOMINAL (N/A Abdomen) OPEN BILATERAL SALPINGECTOMY (Bilateral Abdomen)  Patient location during evaluation: PACU Anesthesia Type: General Level of consciousness: awake and alert and oriented Pain management: pain level controlled Vital Signs Assessment: post-procedure vital signs reviewed and stable Respiratory status: spontaneous breathing Cardiovascular status: stable Postop Assessment: no apparent nausea or vomiting Anesthetic complications: no     Last Vitals:  Vitals:   07/18/19 1300 07/18/19 1315  BP: (!) 157/96 (!) 168/91  Pulse: (!) 110 98  Resp: 20 (!) 21  Temp:    SpO2: 100% 100%    Last Pain:  Vitals:   07/18/19 1315  TempSrc:   PainSc: Asleep                 ADAMS, AMY A

## 2019-07-18 NOTE — Anesthesia Preprocedure Evaluation (Addendum)
Anesthesia Evaluation  Patient identified by MRN, date of birth, ID band Patient awake    Reviewed: Allergy & Precautions, NPO status , Patient's Chart, lab work & pertinent test results  Airway Mallampati: III  TM Distance: >3 FB Neck ROM: Full    Dental  (+) Missing   Pulmonary shortness of breath, Patient abstained from smoking., former smoker,    Pulmonary exam normal breath sounds clear to auscultation       Cardiovascular Exercise Tolerance: Good Normal cardiovascular exam+ Valvular Problems/Murmurs  Rhythm:Regular Rate:Normal     Neuro/Psych PSYCHIATRIC DISORDERS Anxiety Depression    GI/Hepatic negative GI ROS, Neg liver ROS,   Endo/Other    Renal/GU      Musculoskeletal   Abdominal   Peds  Hematology  (+) anemia ,   Anesthesia Other Findings   Reproductive/Obstetrics                            Anesthesia Physical Anesthesia Plan  ASA: III  Anesthesia Plan: General   Post-op Pain Management:    Induction: Intravenous  PONV Risk Score and Plan: 4 or greater and Midazolam, Ondansetron and Dexamethasone  Airway Management Planned: Oral ETT  Additional Equipment:   Intra-op Plan:   Post-operative Plan: Extubation in OR  Informed Consent: I have reviewed the patients History and Physical, chart, labs and discussed the procedure including the risks, benefits and alternatives for the proposed anesthesia with the patient or authorized representative who has indicated his/her understanding and acceptance.     Dental advisory given  Plan Discussed with: CRNA  Anesthesia Plan Comments:         Anesthesia Quick Evaluation

## 2019-07-18 NOTE — Brief Op Note (Addendum)
07/18/2019  12:26 PM  PATIENT:  Brittney Melendez  43 y.o. female  PRE-OPERATIVE DIAGNOSIS:  Uterine fibroids, bleeding with anemia  POST-OPERATIVE DIAGNOSIS:  Uterine fibroids, bleeding with anemia  PROCEDURE:  Procedure(s): HYSTERECTOMY SUPRACERVICAL ABDOMINAL (N/A) OPEN BILATERAL SALPINGECTOMY (Bilateral)  SURGEON:  Surgeon(s) and Role:    [name], MD - Primary  PHYSICIAN ASSISTANT:   ASSISTANTS: Corrie Dandy, RNFA  ANESTHESIA:   local and general  EBL:  150 mL   BLOOD ADMINISTERED:none  DRAINS: Urinary Catheter (Foley)   LOCAL MEDICATIONS USED:  MARCAINE    and Amount: 30 ml  SPECIMEN:  Source of Specimen:  Uterus, bilateral fallopian tubes  DISPOSITION OF SPECIMEN:  PATHOLOGY  COUNTS:  YES  TOURNIQUET:  * No tourniquets in log *  DICTATION: .Dragon Dictation  PLAN OF CARE: Admit to inpatient   PATIENT DISPOSITION:  PACU - hemodynamically stable.   Delay start of Pharmacological VTE agent (>24hrs) due to surgical blood loss or risk of bleeding: yes Details of procedure patient was taken the operating room prepped and draped for lower abdominal transverse incision surgery.  Timeout was conducted.  Foley catheter was in place.  Gentamicin and Cleocin were given as antibiotic prophylaxis.  The transverse lower abdominal incision was made, with excision of a 3 cm ellipse of redundant skin and fatty tissue to allow for better visualization of the pelvis.  The fascia was opened transversely, the peritoneal cavity entered carefully in the midline with no adhesions below the umbilicus.  The omentum was adherent to the upper abdomen incisions. The large Alexis retractor was positioned in place and the bowel packed away using moistened lap tapes followed by placement of a rolled blue towel, allowing adequate visualization.  Attention was directed to the large soft boggy uterus.  The left round ligament could be taken down and the utero-ovarian ligament exposed.   This was doubly crossclamped with Kelly clamps transected and ligated on each side.  The bladder flap was developed on the left one half of the lower uterine segment.  The uterine vessels on the side were identified were clamped with a curved Heaney clamp x2 along with a Kelly clamp for backbleeding.  The uterine vessels were then transected, and the vessels doubly ligated using 0 chromic once and then 0 Vicryl for good hemostasis and then the backbleeding areas on the uterus were oversewn with figure-of-eight sutures of 0 Vicryl.  Attention was then directed to the opposite side.  On the side the round ligament was similarly taken down, the fallopian tube and utero-ovarian ligaments isolated doubly clamped transected and ligated proximally and distally.  Uterine vessels on this side were then exposed, bladder flap developed.  Doubly clamping with curved Heaney clamps were performed Kelly clamp for backbleeding the uterine vessels transected ligated and confirmed as hemostatic.  The upper cardinal ligament on the side was then clamped with a straight Haney, knife dissection performed and the pedicle ligated with 0 Vicryl.  The opposite upper cardinal ligaments were similarly clamped cut and suture-ligated, at this time we could amputate off the uterine body off of the lower uterine segment to allow improved visualization.  At this point a  Lahey thyroid tenaculum could be placed on the stump of the lower uterine segment, and then the cervix inspected and palpated.  We were approximately 2 cm of cervix left in place.  Was able to core out the upper half of the cervix to allow for the cone-shaped coring of the lower  uterine segment, then the top of the cervical stump was oversewn front to back with a series of interrupted 0 chromic sutures hemostasis was satisfactory.  Tubal removal was then performed on each side identifying the fallopian tube on each side both which were quite large, and elevating them and  grasping across the mesosalpinx with Kelly clamp and then transecting the fallopian tube off the mesosalpinx and tying the pedicle.  On the left side it was a single pedicle and on the right side it was 2 separate pedicles to avoid bunching of the tissues. The pedicles were inspected irrigated inspected for adequacy of hemostasis and considered satisfactory.  The area under the bladder flap was then tacked lightly just in front of the oversewn cervical stump to allow proper positioning of the bladder flap to minimize the area that adhesions could occur.  Hemostasis again confirmed.  The pelvis was irrigated x3 and remained hemostatic upon careful inspection. Laparotomy equipment was removed, sponge and needle counts correct, anterior peritoneum closed with a running 2-0 Vicryl, the fascia closed with running 0 Vicryl, the fascia then injected with 20 cc of Marcaine, then the subcutaneous tissues irrigated, then approximated with interrupted horizontal mattress sutures of 2-0 plain x5 sites followed by subcuticular 4-0 Vicryl closure of the skin.  Sponge and needle counts were correct throughout patient tolerated procedure well and went to recovery room in apparent stable condition.

## 2019-07-19 ENCOUNTER — Encounter (HOSPITAL_COMMUNITY): Payer: Self-pay | Admitting: Obstetrics and Gynecology

## 2019-07-19 LAB — BASIC METABOLIC PANEL
Anion gap: 9 (ref 5–15)
BUN: 10 mg/dL (ref 6–20)
CO2: 28 mmol/L (ref 22–32)
Calcium: 7.7 mg/dL — ABNORMAL LOW (ref 8.9–10.3)
Chloride: 97 mmol/L — ABNORMAL LOW (ref 98–111)
Creatinine, Ser: 0.54 mg/dL (ref 0.44–1.00)
GFR calc Af Amer: >60 mL/min (ref >60)
GFR calc non Af Amer: >60 mL/min (ref >60)
Glucose, Bld: 112 mg/dL — ABNORMAL HIGH (ref 70–99)
Potassium: 3.5 mmol/L (ref 3.5–5.1)
Sodium: 134 mmol/L — ABNORMAL LOW (ref 135–145)

## 2019-07-19 LAB — CBC
HCT: 32.6 % — ABNORMAL LOW (ref 36.0–46.0)
Hemoglobin: 10 g/dL — ABNORMAL LOW (ref 12.0–15.0)
MCH: 26.5 pg (ref 26.0–34.0)
MCHC: 30.7 g/dL (ref 30.0–36.0)
MCV: 86.2 fL (ref 80.0–100.0)
Platelets: 153 10*3/uL (ref 150–400)
RBC: 3.78 MIL/uL — ABNORMAL LOW (ref 3.87–5.11)
WBC: 8.6 10*3/uL (ref 4.0–10.5)
nRBC: 0 % (ref 0.0–0.2)

## 2019-07-19 MED ORDER — CHLORDIAZEPOXIDE HCL 25 MG PO CAPS: 25.0000 mg | ORAL_CAPSULE | Freq: Four times a day (QID) | ORAL | Status: DC | PRN

## 2019-07-19 NOTE — Progress Notes (Signed)
Patient OOB to chair, minimal bright red vaginal bleeding. Patient reported feeling need to void, voided 286mL. Pericare and pads provided. Patient in chair, PCA pump within reach and patient reminded of use. Will continue to monitor.

## 2019-07-20 LAB — CBC WITH DIFFERENTIAL/PLATELET
Abs Immature Granulocytes: 0.05 10*3/uL (ref 0.00–0.07)
Basophils Absolute: 0 10*3/uL (ref 0.0–0.1)
Basophils Relative: 0 %
Eosinophils Absolute: 0.2 10*3/uL (ref 0.0–0.5)
Eosinophils Relative: 2 %
HCT: 31.1 % — ABNORMAL LOW (ref 36.0–46.0)
Hemoglobin: 9.7 g/dL — ABNORMAL LOW (ref 12.0–15.0)
Immature Granulocytes: 1 %
Lymphocytes Relative: 7 %
Lymphs Abs: 0.7 10*3/uL (ref 0.7–4.0)
MCH: 26.6 pg (ref 26.0–34.0)
MCHC: 31.2 g/dL (ref 30.0–36.0)
MCV: 85.4 fL (ref 80.0–100.0)
Monocytes Absolute: 0.6 10*3/uL (ref 0.1–1.0)
Monocytes Relative: 6 %
Neutro Abs: 8 10*3/uL — ABNORMAL HIGH (ref 1.7–7.7)
Neutrophils Relative %: 84 %
Platelets: 168 10*3/uL (ref 150–400)
RBC: 3.64 MIL/uL — ABNORMAL LOW (ref 3.87–5.11)
WBC: 9.6 10*3/uL (ref 4.0–10.5)
nRBC: 0 % (ref 0.0–0.2)

## 2019-07-20 LAB — COMPREHENSIVE METABOLIC PANEL
ALT: 34 U/L (ref 0–44)
AST: 50 U/L — ABNORMAL HIGH (ref 15–41)
Albumin: 3.7 g/dL (ref 3.5–5.0)
Alkaline Phosphatase: 59 U/L (ref 38–126)
Anion gap: 10 (ref 5–15)
BUN: 5 mg/dL — ABNORMAL LOW (ref 6–20)
CO2: 23 mmol/L (ref 22–32)
Calcium: 8.4 mg/dL — ABNORMAL LOW (ref 8.9–10.3)
Chloride: 98 mmol/L (ref 98–111)
Creatinine, Ser: 0.5 mg/dL (ref 0.44–1.00)
GFR calc Af Amer: >60 mL/min (ref >60)
GFR calc non Af Amer: >60 mL/min (ref >60)
Glucose, Bld: 96 mg/dL (ref 70–99)
Potassium: 3.7 mmol/L (ref 3.5–5.1)
Sodium: 131 mmol/L — ABNORMAL LOW (ref 135–145)
Total Bilirubin: 0.9 mg/dL (ref 0.3–1.2)
Total Protein: 7.6 g/dL (ref 6.5–8.1)

## 2019-07-20 LAB — SURGICAL PATHOLOGY

## 2019-07-20 LAB — MAGNESIUM: Magnesium: 1.2 mg/dL — ABNORMAL LOW (ref 1.7–2.4)

## 2019-07-20 LAB — PHOSPHORUS: Phosphorus: 2.6 mg/dL (ref 2.5–4.6)

## 2019-07-20 MED ORDER — FOLIC ACID 1 MG PO TABS
1.0000 mg | ORAL_TABLET | Freq: Every day | ORAL | Status: DC
  Administered 2019-07-20 – 2019-07-21 (×2): 1 mg via ORAL
  Filled 2019-07-20 (×2): qty 1

## 2019-07-20 MED ORDER — LORAZEPAM 2 MG/ML IJ SOLN
1.0000 mg | INTRAMUSCULAR | Status: DC | PRN
  Administered 2019-07-20: 1 mg via INTRAVENOUS
  Administered 2019-07-20: 2 mg via INTRAVENOUS
  Administered 2019-07-21: 1 mg via INTRAVENOUS
  Filled 2019-07-20: qty 2
  Filled 2019-07-20 (×2): qty 1

## 2019-07-20 MED ORDER — THIAMINE HCL 100 MG/ML IJ SOLN: 100.0000 mg | Freq: Every day | INTRAMUSCULAR | Status: DC

## 2019-07-20 MED ORDER — VITAMIN B-1 100 MG PO TABS
100.0000 mg | ORAL_TABLET | Freq: Every day | ORAL | Status: DC
  Administered 2019-07-20 – 2019-07-21 (×2): 100 mg via ORAL
  Filled 2019-07-20 (×2): qty 1

## 2019-07-20 MED ORDER — LORAZEPAM 1 MG PO TABS: 1.0000 mg | ORAL_TABLET | ORAL | Status: DC | PRN

## 2019-07-20 MED ORDER — ADULT MULTIVITAMIN W/MINERALS CH
1.0000 | ORAL_TABLET | Freq: Every day | ORAL | Status: DC
  Administered 2019-07-20 – 2019-07-21 (×2): 1 via ORAL
  Filled 2019-07-20 (×2): qty 1

## 2019-07-20 NOTE — Progress Notes (Signed)
365 gram uterus, 12-14 weeks size, much smaller after the lupron than previously estimated. Benign endometrium and fibroids.

## 2019-07-20 NOTE — Plan of Care (Signed)
  Problem: Education: Goal: Knowledge of General Education information will improve Description: Including pain rating scale, medication(s)/side effects and non-pharmacologic comfort measures Outcome: Progressing   Problem: Clinical Measurements: Goal: Will remain free from infection Outcome: Progressing   

## 2019-07-20 NOTE — Progress Notes (Addendum)
2 Days Post-Op Procedure(s) (LRB): HYSTERECTOMY SUPRACERVICAL ABDOMINAL (N/A) OPEN BILATERAL SALPINGECTOMY (Bilateral)  Subjective: Patient reports incisional pain.  She is anxious about going home. She does have an Auntie who agrees on the phone to be the support person Brittney Melendez will need. Brittney Melendez is having some jitters from alcohol withdrawal.  While I am convinced she can go home from a surgical standpoint I am concerned about the alcohol withdrawal and will be keeping her an extra 24 hours and initiating CIWA protocol to identify the correct doses of Ativan for home management, and obtain social work consult.  Objective: I have reviewed patient's vital signs, medications and labs.  She has passed gas ,is tolerating food.  General: alert, appears stated age and distracted Resp: clear to auscultation bilaterally GI: soft, non-tender; bowel sounds normal; no masses,  no organomegaly and incision: clean, dry and intact Extremities: extremities normal, atraumatic, no cyanosis or edema and Homans sign is negative, no sign of DVT Vaginal Bleeding: minimal  Assessment: s/p Procedure(s): HYSTERECTOMY SUPRACERVICAL ABDOMINAL (N/A) OPEN BILATERAL SALPINGECTOMY (Bilateral): stable and Alcohol withdrawal, needs adressing with CIWA protocol.  Plan: Advance diet Encourage ambulation Discontinue IV fluids social work consult.' Early d/c tomorrow.  LOS: 2 days    [name] 07/20/2019, 9:05 AM

## 2019-07-21 MED ORDER — OXYCODONE-ACETAMINOPHEN 5-325 MG PO TABS
1.0000 | ORAL_TABLET | Freq: Once | ORAL | Status: AC
  Administered 2019-07-21: 1 via ORAL
  Filled 2019-07-21 (×2): qty 1

## 2019-07-21 MED ORDER — LORAZEPAM 1 MG PO TABS: 1.0000 mg | ORAL_TABLET | Freq: Three times a day (TID) | ORAL | 0 refills | Status: DC | PRN

## 2019-07-21 MED ORDER — FOLIC ACID 1 MG PO TABS: 1.0000 mg | ORAL_TABLET | Freq: Every day | ORAL | 1 refills | Status: DC

## 2019-07-21 MED ORDER — ADULT MULTIVITAMIN W/MINERALS CH: 1.0000 | ORAL_TABLET | Freq: Every day | ORAL | 99 refills | Status: DC

## 2019-07-21 MED ORDER — OXYCODONE-ACETAMINOPHEN 5-325 MG PO TABS: 1.0000 | ORAL_TABLET | Freq: Four times a day (QID) | ORAL | 0 refills | Status: DC | PRN

## 2019-07-21 NOTE — Progress Notes (Signed)
Nsg Discharge Note  Admit Date:  07/18/2019 Discharge date: 07/21/2019   Brittney Melendez to be D/C'd Home per MD order.  AVS completed.  Copy for chart, and copy for patient signed, and dated. Removed IV-clean, dry, intact. Reviewed d/c paperwork with patient and aunt(in the car). Answered all questions. Wheeled stable patient and belongings to main entrance where she was picked up by her aunt to d/c to home. Patient/caregiver able to verbalize understanding.  Discharge Medication: Allergies as of 07/21/2019      Reactions   Rocephin [ceftriaxone] Rash   Sulfa Antibiotics Itching      Medication List    STOP taking these medications   clonazePAM 1 MG tablet Commonly known as: KlonoPIN   megestrol 40 MG tablet Commonly known as: MEGACE   MULTIVITAMIN/IRON PO   potassium chloride SA 20 MEQ tablet Commonly known as: KLOR-CON     TAKE these medications   escitalopram 10 MG tablet Commonly known as: Lexapro Take 1 tablet (10 mg total) by mouth daily.   ferrous sulfate 325 (65 FE) MG tablet Take 1 tablet (325 mg total) by mouth 2 (two) times daily with a meal.   folic acid 1 MG tablet Commonly known as: FOLVITE Take 1 tablet (1 mg total) by mouth daily. What changed: Another medication with the same name was added. Make sure you understand how and when to take each.   folic acid 1 MG tablet Commonly known as: FOLVITE Take 1 tablet (1 mg total) by mouth daily. What changed: You were already taking a medication with the same name, and this prescription was added. Make sure you understand how and when to take each.   LORazepam 1 MG tablet Commonly known as: ATIVAN Take 1 tablet (1 mg total) by mouth every 8 (eight) hours as needed for anxiety (withdrawal symptoms:  anxiety, agitation, insomnia, diaphoresis, nausea, vomiting, tremors, tachycardia, or hypertension.).   multivitamin with minerals Tabs tablet Take 1 tablet by mouth daily.   oxyCODONE-acetaminophen 5-325  MG tablet Commonly known as: PERCOCET/ROXICET Take 1-2 tablets by mouth every 6 (six) hours as needed for moderate pain or severe pain.       Discharge Assessment: Vitals:   07/20/19 2100 07/21/19 0612  BP: (!) 144/76 (!) 143/95  Pulse: 93 80  Resp: 18 18  Temp: 100 F (37.8 C) 99.1 F (37.3 C)  SpO2: 99% 100%   Skin clean, dry and intact without evidence of skin break down, no evidence of skin tears noted. IV catheter discontinued intact. Site without signs and symptoms of complications - no redness or edema noted at insertion site, patient denies c/o pain - only slight tenderness at site.  Dressing with slight pressure applied.  D/c Instructions-Education: Discharge instructions given to patient/family with verbalized understanding. D/c education completed with patient/family including follow up instructions, medication list, d/c activities limitations if indicated, with other d/c instructions as indicated by MD - patient able to verbalize understanding, all questions fully answered. Patient instructed to return to ED, call 911, or call MD for any changes in condition.  Patient escorted via [place], and D/C home via private auto.  Santa Lighter, RN 07/21/2019 1:24 PM

## 2019-07-21 NOTE — Discharge Instructions (Signed)
Alcohol Withdrawal Syndrome When a person who drinks a lot of alcohol stops drinking, he or she may have unpleasant and serious symptoms. These symptoms are called alcohol withdrawal syndrome. This condition may be mild or severe. It can be life-threatening. It can cause:  Shaking that you cannot control (tremor).  Sweating.  Headache.  Feeling fearful, upset, grouchy, or depressed.  Trouble sleeping (insomnia).  Nightmares.  Fast or uneven heartbeats (palpitations).  Alcohol cravings.  Feeling sick to your stomach (nausea).  Throwing up (vomiting).  Being bothered by light and sounds.  Confusion.  Trouble thinking clearly.  Not being hungry (loss of appetite).  Big changes in mood (mood swings). If you have all of the following symptoms at the same time, get help right away:  High blood pressure.  Fast heartbeat.  Trouble breathing.  Seizures.  Seeing, hearing, feeling, smelling, or tasting things that are not there (hallucinations). These symptoms are known as delirium tremens (DTs). They must be treated at the hospital right away. Follow these instructions at home:   Take over-the-counter and prescription medicines only as told by your doctor. This includes vitamins.  Do not drink alcohol.  Do not drive until your doctor says that this is safe for you.  Have someone stay with you or be available in case you need help. This should be someone you trust. This person can help you with your symptoms. He or she can also help you to not drink.  Drink enough fluid to keep your pee (urine) pale yellow.  Think about joining a support group or a treatment program to help you stop drinking.  Keep all follow-up visits as told by your doctor. This is important. Contact a doctor if:  Your symptoms get worse.  You cannot eat or drink without throwing up.  You have a hard time not drinking alcohol.  You cannot stop drinking alcohol. Get help right away  if:  You have fast or uneven heartbeats.  You have chest pain.  You have trouble breathing.  You have a seizure for the first time.  You see, hear, feel, smell, or taste something that is not there.  You get very confused. Summary  When a person who drinks a lot of alcohol stops drinking, he or she may have serious symptoms. This is called alcohol withdrawal syndrome.  Delirium tremens (DTs) is a group of life-threatening symptoms. You should get help right away if you have these symptoms.  Think about joining an alcohol support group or a treatment program. This information is not intended to replace advice given to you by your health care provider. Make sure you discuss any questions you have with your health care provider. Document Released: 01/13/2008 Document Revised: 07/09/2017 Document Reviewed: 04/02/2017 Elsevier Patient Education  2020 [place].    Abdominal Hysterectomy Abdominal hysterectomy is a surgical procedure to remove the womb (uterus). The uterus is the muscular organ that houses a developing baby. This surgery may be done if:  You have cancer.  You have growths (tumors or fibroids) in the uterus.  You have long-term (chronic) pain.  You are bleeding.  Your uterus has slipped down into your vagina (uterine prolapse).  You have a condition in which the tissue that lines the uterus grows outside of its normal location (endometriosis).  You have an infection in your uterus.  You are having problems with your menstrual cycle. Depending on why you are having this procedure, you may also have other reproductive organs removed.  These could include:  The part of your vagina that connects with your uterus (cervix).  The organs that make eggs (ovaries).  The tubes that connect the ovaries to the uterus (fallopian tubes). Tell a health care provider about:  Any allergies you have.  All medicines you are taking, including vitamins, herbs, eye drops,  creams, and over-the-counter medicines.  Any problems you or family members have had with anesthetic medicines.  Any blood disorders you have.  Any surgeries you have had.  Any medical conditions you have.  Whether you are pregnant or may be pregnant. What are the risks? Generally, this is a safe procedure. However, problems may occur, including:  Bleeding.  Infection.  Allergic reactions to medicines or dyes.  Damage to other structures or organs.  Nerve injury.  Decreased interest in sex or pain during sex.  Blood clots that can break free and travel to your lungs. What happens before the procedure? Staying hydrated Follow instructions from your health care provider about hydration, which may include:  Up to 2 hours before the procedure - you may continue to drink clear liquids, such as water, clear fruit juice, black coffee, and plain tea Eating and drinking restrictions Follow instructions from your health care provider about eating and drinking, which may include:  8 hours before the procedure - stop eating heavy meals or foods such as meat, fried foods, or fatty foods.  6 hours before the procedure - stop eating light meals or foods, such as toast or cereal.  6 hours before the procedure - stop drinking milk or drinks that contain milk.  2 hours before the procedure - stop drinking clear liquids. Medicines  Ask your health care provider about: ? Changing or stopping your regular medicines. This is especially important if you are taking diabetes medicines or blood thinners. ? Taking medicines such as aspirin and ibuprofen. These medicines can thin your blood. Do not take these medicines before your procedure if your health care provider instructs you not to.  You may be given antibiotic medicine to help prevent infection. Take it as told by your health care provider.  You may be asked to take laxatives to prevent constipation. General instructions  Ask your  health care provider how your surgical site will be marked or identified.  You may be asked to shower with a germ-killing soap.  Plan to have someone take you home from the hospital.  Do not use any products that contain nicotine or tobacco, such as cigarettes and e-cigarettes. If you need help quitting, ask your health care provider.  You may have an exam or testing.  You may have a blood or urine sample taken.  You may need to have an enema to clean out your rectum and lower colon.  This procedure can affect the way you feel about yourself. Talk to your health care provider about the physical and emotional changes this procedure may cause. What happens during the procedure?  To lower your risk of infection: ? Your health care team will wash or sanitize their hands. ? Your skin will be washed with soap. ? Hair may be removed from the surgical area.  An IV tube will be inserted into one of your veins.  You will be given one or more of the following: ? A medicine to help you relax (sedative). ? A medicine to make you fall asleep (general anesthetic).  Tight-fitting (compression) stockings will be placed on your legs to promote circulation.  A thin,  flexible tube (catheter) will be inserted to help drain your urine.  The surgeon will make a cut (incision) through the skin in your lower belly. The incision may go side-to-side or up-and-down.  The surgeon will move aside the body tissue that covers your uterus. The surgeon will then carefully take out your uterus along with any of the other organs that need to be removed.  Bleeding will be controlled with clamps or sutures.  The surgeon will close your incision with stitches (sutures), skin glue, or adhesive strips.  A bandage (dressing) will be placed over the incision. The procedure may vary among health care providers and hospitals. What happens after the procedure?  You will be given pain medicine as needed.  Your blood  pressure, heart rate, breathing rate, and blood oxygen level will be monitored until the medicines you were given have worn off.  You will need to stay in the hospital to recover for one to two days. Ask your health care provider how long you will need to stay in the hospital after your procedure.  You may have a liquid diet at first. You will most likely return to your usual diet the day after surgery.  You will still have the urinary catheter in place. It will likely be removed the day after surgery.  You may have to wear compression stockings. These stockings help to prevent blood clots and reduce swelling in your legs.  You will be encouraged to walk as soon as possible. You will also use a device or do breathing exercises to keep your lungs clear.  You may need to use a sanitary napkin for vaginal discharge. Summary  Abdominal hysterectomy is a surgical procedure to remove the womb (uterus). The uterus is the muscular organ that houses a developing baby.  This procedure can affect the way you feel about yourself. Talk to your health care provider about the physical and emotional changes this procedure may cause.  You will be given medicines for pain after the procedure.  You will need to stay in the hospital to recover. Ask your health care provider how long you will need to stay in the hospital after your procedure. This information is not intended to replace advice given to you by your health care provider. Make sure you discuss any questions you have with your health care provider. Document Released: 08/01/2013 Document Revised: 08/31/2018 Document Reviewed: 07/15/2016 Elsevier Patient Education  2020 [employer].

## 2019-07-21 NOTE — Progress Notes (Signed)
3 Days Post-Op Procedure(s) (LRB): HYSTERECTOMY SUPRACERVICAL ABDOMINAL (N/A) OPEN BILATERAL SALPINGECTOMY (Bilateral)  Subjective: Patient reports lip dryness, shakiness is an issue, did better on ativan. .    Objective: I have reviewed patient's intake and output, medications and pathology. CBC Latest Ref Rng & Units 07/20/2019 07/19/2019 07/14/2019  WBC 4.0 - 10.5 K/uL 9.6 8.6 5.8  Hemoglobin 12.0 - 15.0 g/dL 9.7(L) 10.0(L) 12.2  Hematocrit 36.0 - 46.0 % 31.1(L) 32.6(L) 39.0  Platelets 150 - 400 K/uL 168 153 195   CMP Latest Ref Rng & Units 07/20/2019 07/19/2019 07/18/2019  Glucose 70 - 99 mg/dL 96 112(H) 110(H)  BUN 6 - 20 mg/dL 5(L) 10 14  Creatinine 0.44 - 1.00 mg/dL 0.50 0.54 0.48  Sodium 135 - 145 mmol/L 131(L) 134(L) 139  Potassium 3.5 - 5.1 mmol/L 3.7 3.5 3.7  Chloride 98 - 111 mmol/L 98 97(L) 100  CO2 22 - 32 mmol/L 23 28 27   Calcium 8.9 - 10.3 mg/dL 8.4(L) 7.7(L) 9.2  Total Protein 6.5 - 8.1 g/dL 7.6 - 8.4(H)  Total Bilirubin 0.3 - 1.2 mg/dL 0.9 - 0.9  Alkaline Phos 38 - 126 U/L 59 - 79  AST 15 - 41 U/L 50(H) - 78(H)  ALT 0 - 44 U/L 34 - 52(H)    General: alert, cooperative and mildly anxious, committed to avoiding etoh Resp: clear to auscultation bilaterally GI: soft, non-tender; bowel sounds normal; no masses,  no organomegaly Extremities: mild tremor, able to feed self. Vaginal Bleeding: minimal  Assessment: s/p Procedure(s): HYSTERECTOMY SUPRACERVICAL ABDOMINAL (N/A) OPEN BILATERAL SALPINGECTOMY (Bilateral): stable and etoh withdrawal, mild  Plan: Discharge home  LOS: 3 days    Brittney Melendez 07/21/2019, 7:56 AM

## 2019-07-21 NOTE — Clinical Social Work Note (Signed)
Late Entry for 07/21/2019: Met with patient and discussed SA rehab and counseling needs. Patient states that she has contacted an inpatient rehab facility in [place] and will be going in to treatment in January. Patient was accepting of additional SA resources and counseling resources. Patient verbalized no further needs.    Terel Bann, Clydene Pugh, LCSW

## 2019-07-21 NOTE — Plan of Care (Signed)
  Problem: Education: Goal: Knowledge of General Education information will improve Description: Including pain rating scale, medication(s)/side effects and non-pharmacologic comfort measures 07/21/2019 0930 by Santa Lighter, RN Outcome: Adequate for Discharge 07/21/2019 0930 by Santa Lighter, RN Outcome: Progressing   Problem: Clinical Measurements: Goal: Ability to maintain clinical measurements within normal limits will improve Outcome: Adequate for Discharge Goal: Will remain free from infection Outcome: Adequate for Discharge Goal: Diagnostic test results will improve Outcome: Adequate for Discharge Goal: Respiratory complications will improve Outcome: Adequate for Discharge Goal: Cardiovascular complication will be avoided Outcome: Adequate for Discharge   Problem: Activity: Goal: Risk for activity intolerance will decrease Outcome: Adequate for Discharge   Problem: Nutrition: Goal: Adequate nutrition will be maintained Outcome: Adequate for Discharge   Problem: Coping: Goal: Level of anxiety will decrease Outcome: Adequate for Discharge   Problem: Elimination: Goal: Will not experience complications related to bowel motility Outcome: Adequate for Discharge Goal: Will not experience complications related to urinary retention Outcome: Adequate for Discharge   Problem: Pain Managment: Goal: General experience of comfort will improve Outcome: Adequate for Discharge   Problem: Safety: Goal: Ability to remain free from injury will improve Outcome: Adequate for Discharge   Problem: Skin Integrity: Goal: Risk for impaired skin integrity will decrease Outcome: Adequate for Discharge

## 2019-07-21 NOTE — Discharge Summary (Signed)
Physician Discharge Summary  Patient ID: Brittney Melendez MRN: [id number] DOB/AGE: [birth year] 43 y.o.  Admit date: 07/18/2019 Discharge date: 07/21/2019  Admission Diagnoses:uterine fibroids Menorrhagia, anemia  Discharge Diagnoses:  Active Problems:   Menorrhagia   Alcohol abuse   Uterine fibroid   Status post abdominal supracervical subtotal hysterectomy   Discharged Condition: fair  Hospital Course: this 43 yr female was admitted for supracervical hysterectomy with bilateral salpingectomy, and did well surgically. She had been drinking daily til 2 days before admission, 3+ drinks with hard liquor, and required alcohol withdrawal tx with MVI, folate, Ativan, and an extra day for continued assessment.  Consults: None  Significant Diagnostic Studies: labs:  CBC Latest Ref Rng & Units 07/20/2019 07/19/2019 07/14/2019  WBC 4.0 - 10.5 K/uL 9.6 8.6 5.8  Hemoglobin 12.0 - 15.0 g/dL 9.7(L) 10.0(L) 12.2  Hematocrit 36.0 - 46.0 % 31.1(L) 32.6(L) 39.0  Platelets 150 - 400 K/uL 168 153 195    CMP Latest Ref Rng & Units 07/20/2019 07/19/2019 07/18/2019  Glucose 70 - 99 mg/dL 96 112(H) 110(H)  BUN 6 - 20 mg/dL 5(L) 10 14  Creatinine 0.44 - 1.00 mg/dL 0.50 0.54 0.48  Sodium 135 - 145 mmol/L 131(L) 134(L) 139  Potassium 3.5 - 5.1 mmol/L 3.7 3.5 3.7  Chloride 98 - 111 mmol/L 98 97(L) 100  CO2 22 - 32 mmol/L 23 28 27   Calcium 8.9 - 10.3 mg/dL 8.4(L) 7.7(L) 9.2  Total Protein 6.5 - 8.1 g/dL 7.6 - 8.4(H)  Total Bilirubin 0.3 - 1.2 mg/dL 0.9 - 0.9  Alkaline Phos 38 - 126 U/L 59 - 79  AST 15 - 41 U/L 50(H) - 78(H)  ALT 0 - 44 U/L 34 - 52(H)    Treatments: surgery: abdominal supracervical hysterectomy, bilateral salpingectomy  Discharge Exam: Blood pressure (!) 143/95, pulse 80, temperature 99.1 F (37.3 C), temperature source Oral, resp. rate 18, height 5\' 7"  (1.702 m), weight 81 kg, last menstrual period 07/18/2019, SpO2 100 %. General appearance: alert and cooperative Head:  Normocephalic, without obvious abnormality, atraumatic Resp: clear to auscultation bilaterally GI: soft, non-tender; bowel sounds normal; no masses,  no organomegaly Extremities: extremities normal, atraumatic, no cyanosis or edema and mild tremor, able to feed self, and do self care Incision/Wound:clean dry intact, dressing in place.  Disposition:  To stay with auntie.  Allergies as of 07/21/2019      Reactions   Rocephin [ceftriaxone] Rash   Sulfa Antibiotics Itching      Medication List    STOP taking these medications   clonazePAM 1 MG tablet Commonly known as: KlonoPIN   megestrol 40 MG tablet Commonly known as: MEGACE   MULTIVITAMIN/IRON PO   potassium chloride SA 20 MEQ tablet Commonly known as: KLOR-CON     TAKE these medications   escitalopram 10 MG tablet Commonly known as: Lexapro Take 1 tablet (10 mg total) by mouth daily.   ferrous sulfate 325 (65 FE) MG tablet Take 1 tablet (325 mg total) by mouth 2 (two) times daily with a meal.   folic acid 1 MG tablet Commonly known as: FOLVITE Take 1 tablet (1 mg total) by mouth daily. What changed: Another medication with the same name was added. Make sure you understand how and when to take each.   folic acid 1 MG tablet Commonly known as: FOLVITE Take 1 tablet (1 mg total) by mouth daily. What changed: You were already taking a medication with the same name, and this prescription was added. Make sure you  understand how and when to take each.   LORazepam 1 MG tablet Commonly known as: ATIVAN Take 1 tablet (1 mg total) by mouth every 8 (eight) hours as needed for anxiety (withdrawal symptoms:  anxiety, agitation, insomnia, diaphoresis, nausea, vomiting, tremors, tachycardia, or hypertension.).   multivitamin with minerals Tabs tablet Take 1 tablet by mouth daily.   oxyCODONE-acetaminophen 5-325 MG tablet Commonly known as: PERCOCET/ROXICET Take 1-2 tablets by mouth every 6 (six) hours as needed for moderate  pain or severe pain.        Signed: [name] 07/21/2019, 8:12 AM

## 2019-07-21 NOTE — Plan of Care (Signed)
  Problem: Education: Goal: Knowledge of General Education information will improve Description Including pain rating scale, medication(s)/side effects and non-pharmacologic comfort measures Outcome: Progressing   Problem: Health Behavior/Discharge Planning: Goal: Ability to manage health-related needs will improve Outcome: Progressing   

## 2019-07-26 ENCOUNTER — Telehealth: Payer: Self-pay | Admitting: Obstetrics and Gynecology

## 2019-07-26 ENCOUNTER — Telehealth: Payer: Self-pay | Admitting: *Deleted

## 2019-07-26 MED ORDER — HYDROCODONE-ACETAMINOPHEN 5-325 MG PO TABS: 1.0000 | ORAL_TABLET | Freq: Four times a day (QID) | ORAL | 0 refills | Status: DC | PRN

## 2019-07-26 NOTE — Telephone Encounter (Signed)
Patient called requesting refill on pain medication.  States she is still very sore. Advised she could also take Ibuprofen in between the time she takes her Percocet. Informed I spoke with Dr Glo Herring and he will be able to send in the medication in about an hour to her pharmacy.  Pt verbalized understanding.

## 2019-07-26 NOTE — Telephone Encounter (Signed)
Pt improving on postop pain, able to reduce to vicodin from percocet. Pt counseled to take ibuprofen as directed. Pt states that she is not drinking at all and feels much better already.

## 2019-07-28 ENCOUNTER — Telehealth: Payer: Self-pay | Admitting: Obstetrics and Gynecology

## 2019-07-28 NOTE — Telephone Encounter (Signed)
Called patient regarding appointment scheduled in our office and advised to come alone to the visits, however, a support person, over age 45, may accompany her  to appointment if assistance is needed for safety or care concerns. Otherwise, support persons should remain outside until the visit is complete.   We ask if you have had any exposure to anyone suspected or confirmed of having COVID-19, are awaiting test results for COVID-19 or if you are experiencing any of the following, to call and reschedule your appointment: fever, cough, shortness of breath, muscle pain, diarrhea, rash, vomiting, abdominal pain, red eye, weakness, bruising, bleeding, joint pain, or a severe headache.   Please know we will ask you these questions or similar questions when you arrive for your appointment and again it's how we are keeping everyone safe.    Also,to keep you safe, please use the provided hand sanitizer when you enter the office. We are asking everyone in the office to wear a mask to help prevent the spread of germs. If you have a mask of your own, please wear it to your appointment, if not, we are happy to provide one for you.  Thank you for understanding and your cooperation.    [facility] Staff    

## 2019-07-31 ENCOUNTER — Encounter: Payer: Self-pay | Admitting: Obstetrics and Gynecology

## 2019-07-31 ENCOUNTER — Other Ambulatory Visit: Payer: Self-pay

## 2019-07-31 ENCOUNTER — Ambulatory Visit (INDEPENDENT_AMBULATORY_CARE_PROVIDER_SITE_OTHER): Payer: Medicaid Other | Admitting: Obstetrics and Gynecology

## 2019-07-31 VITALS — BP 136/87 | HR 86 | Ht 67.0 in | Wt 173.0 lb

## 2019-07-31 DIAGNOSIS — Z9889 Other specified postprocedural states: Secondary | ICD-10-CM

## 2019-07-31 DIAGNOSIS — D250 Submucous leiomyoma of uterus: Secondary | ICD-10-CM

## 2019-07-31 DIAGNOSIS — D251 Intramural leiomyoma of uterus: Secondary | ICD-10-CM

## 2019-07-31 DIAGNOSIS — Z09 Encounter for follow-up examination after completed treatment for conditions other than malignant neoplasm: Secondary | ICD-10-CM | POA: Insufficient documentation

## 2019-07-31 MED ORDER — OXYCODONE-ACETAMINOPHEN 5-325 MG PO TABS: 1.0000 | ORAL_TABLET | Freq: Four times a day (QID) | ORAL | 0 refills | Status: DC | PRN

## 2019-07-31 MED ORDER — LORAZEPAM 1 MG PO TABS: 1.0000 mg | ORAL_TABLET | Freq: Three times a day (TID) | ORAL | 0 refills | Status: DC | PRN

## 2019-07-31 NOTE — Progress Notes (Signed)
Patient ID: Brittney Melendez, female   DOB: 08/30/1975, 43 y.o.   MRN: [id number]  Subjective:  Brittney Melendez is a 43 y.o. female now 2 weeks status post supracervical abdominal hysterectomyy  and open bilateral salpingectomy.    feels tenderness," feels as if brick is in stomach". She has trouble sleeping and is painful. Pain meds, hydrocodone don't help much. She likes the oxycodone better. She is able to walk but doesn't do much. Had question about taking ativan and she has not had any alcohol.  Review of Systems Negative except hematoma, trouble sleeping   Diet:   normal   Bowel movements : normal.  Pain is not well controlled.  Medications being used: narcotic analgesics including hydrocodone/acetaminophen (Lorcet, Lortab, Norco, Vicodin).  Objective:  LMP 07/18/2019 Comment: on at present General:Well developed, well nourished.  No acute distress. Abdomen: Bowel sounds normal, soft, non-tender. Pelvic Exam: Deferred   Incision(s): 6 cm x 20 cm deep subcutaneous hematoma in incision, no drainage, no erythema, minimal swelling, no dehiscence,  Assessment:  Post-Op 2 weeks s/p supracervical abdominal hysterectomyy  and open bilateral salpingectomy   Subcutaneous hematoma, no erythema, no ecchymosis, will allow to spontaneously absorb.   Plan:  1.Wound care discussed  Aloe and neosporin 2. current medications.Switched hydrocodone to oxycodone, refill ativan 3. Activity restrictions: no bending, stooping, or squatting and no driving 4. return to work: not applicable. 5. Follow up in 4 weeks.  By signing my name below, I, [name], attest that this documentation has been prepared under the direction and in the presence of [name], MD. Electronically Signed: [place]. 07/31/19. 10:40 AM.  I personally performed the services described in this documentation, which was SCRIBED in my presence. The recorded information has been reviewed and  considered accurate. It has been edited as necessary during review. [name], MD

## 2019-08-08 ENCOUNTER — Telehealth: Payer: Self-pay | Admitting: *Deleted

## 2019-08-08 NOTE — Telephone Encounter (Signed)
Patient left message that she is still having discomfort and has noticed an odor after showering, feet are hurting and she cannot lay flat.  Please call.

## 2019-08-08 NOTE — Telephone Encounter (Signed)
Telephoned patient at home number. Patient states having muscle spasms, tightness at incision sight and feels like needles poking. Also having problems with feet hurting. Patient states using ibuprofen every 6 hours for pain and it's not helping. Advised patient would talk with Dr. Glo Herring 12/30 and give her a call back. Patient voiced understanding.

## 2019-08-09 ENCOUNTER — Telehealth: Payer: Self-pay | Admitting: Obstetrics and Gynecology

## 2019-08-09 NOTE — Telephone Encounter (Signed)
Patient reports no bruising around incision, just itching and discomfort. Taking Ibuprofen, out of oxycodone til renewal available in 4 -5 d.

## 2019-08-09 NOTE — Telephone Encounter (Signed)
I spoke with pt. She's uncomfortable but no fever or chills. Will follow

## 2019-08-21 ENCOUNTER — Telehealth: Payer: Self-pay | Admitting: *Deleted

## 2019-08-21 NOTE — Telephone Encounter (Signed)
Pt requesting oxycodone and ativan to be sent in. She is out and having pain.

## 2019-08-22 MED ORDER — TRAMADOL HCL 50 MG PO TABS: 100.0000 mg | ORAL_TABLET | Freq: Four times a day (QID) | ORAL | 0 refills | Status: AC | PRN

## 2019-08-22 NOTE — Telephone Encounter (Signed)
Patient requests another Ativan refill on 08/21/2019, only 14 days status post her last refill of 30 Ativan.  Will not refill.  She has an appointment later this week for reassessment and will address her anxiolytic use at that time thank you

## 2019-08-22 NOTE — Telephone Encounter (Signed)
Patient reports continued pelvic discomfort now 4 weeks status post hysterectomy.  She was also going through alcohol withdrawal during the acute postop time which may be affecting her pain tolerance is. We will give the patient a few tramadol to last until her appointment next week.  We will consider nonopioid therapy only after next week's visit

## 2019-08-23 ENCOUNTER — Telehealth: Payer: Self-pay | Admitting: Lactation Services

## 2019-08-23 NOTE — Telephone Encounter (Signed)
Opened in error

## 2019-08-23 NOTE — Telephone Encounter (Signed)
Received a call from [place] at [facility]. They are requesting the the Hysterectomy Acknowledgement Consent form be faxed to them at [phone]. Their phone # is [phone]. They asked that all correspondence include the Reference [id number].

## 2019-08-28 ENCOUNTER — Encounter: Payer: Medicaid Other | Admitting: Obstetrics and Gynecology

## 2019-08-29 ENCOUNTER — Encounter: Payer: Medicaid Other | Admitting: Obstetrics and Gynecology

## 2019-08-31 ENCOUNTER — Telehealth: Payer: Self-pay | Admitting: Obstetrics and Gynecology

## 2019-08-31 NOTE — Telephone Encounter (Signed)
Tried to reach the patient to remind her of her appointment/restrictions, mailbox is full.

## 2019-09-01 ENCOUNTER — Encounter: Payer: Medicaid Other | Admitting: Obstetrics and Gynecology

## 2019-09-04 ENCOUNTER — Telehealth: Payer: Self-pay | Admitting: Obstetrics and Gynecology

## 2019-09-04 NOTE — Telephone Encounter (Signed)
Tried to reach the patient to remind her of her appointment/restrictions, mailbox is full.

## 2019-09-05 ENCOUNTER — Encounter: Payer: Medicaid Other | Admitting: Obstetrics and Gynecology

## 2019-09-19 ENCOUNTER — Encounter: Payer: Medicaid Other | Admitting: Obstetrics and Gynecology

## 2019-09-26 ENCOUNTER — Encounter: Payer: Medicaid Other | Admitting: Obstetrics and Gynecology

## 2019-10-05 ENCOUNTER — Encounter: Payer: Medicaid Other | Admitting: Obstetrics and Gynecology

## 2019-10-16 ENCOUNTER — Telehealth: Payer: Self-pay | Admitting: Obstetrics and Gynecology

## 2019-10-16 NOTE — Telephone Encounter (Signed)

## 2019-10-17 ENCOUNTER — Ambulatory Visit (INDEPENDENT_AMBULATORY_CARE_PROVIDER_SITE_OTHER): Payer: Medicaid Other | Admitting: Obstetrics and Gynecology

## 2019-10-17 ENCOUNTER — Encounter: Payer: Medicaid Other | Admitting: Obstetrics and Gynecology

## 2019-10-17 ENCOUNTER — Encounter: Payer: Self-pay | Admitting: Obstetrics and Gynecology

## 2019-10-17 ENCOUNTER — Other Ambulatory Visit: Payer: Self-pay

## 2019-10-17 VITALS — BP 134/86 | HR 79 | Ht 67.0 in | Wt 184.2 lb

## 2019-10-17 DIAGNOSIS — Z09 Encounter for follow-up examination after completed treatment for conditions other than malignant neoplasm: Secondary | ICD-10-CM
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
## 2020-01-23 ENCOUNTER — Other Ambulatory Visit: Payer: Self-pay | Admitting: *Deleted

## 2020-01-24 MED ORDER — FOLIC ACID 1 MG PO TABS: 1.0000 mg | ORAL_TABLET | Freq: Every day | ORAL | 6 refills | Status: AC

## 2020-01-24 NOTE — Telephone Encounter (Signed)
Folic acid Rx refilled, 1 mg/d

## 2020-02-11 IMAGING — US US PELVIS COMPLETE WITH TRANSVAGINAL
1 series · 13 of 25 positions shown · non-contrast
Comparison: Pelvis ultrasound 06/17/2018.

CLINICAL DATA: 42-year-old female with severe pelvic bleeding for 8
days. LMP 01/25/2019. History of fibroids.



[Series 1: us pelvis complete with transvaginal · 0.24mm/px · 13 of 201 slices shown]
[im 1/201]
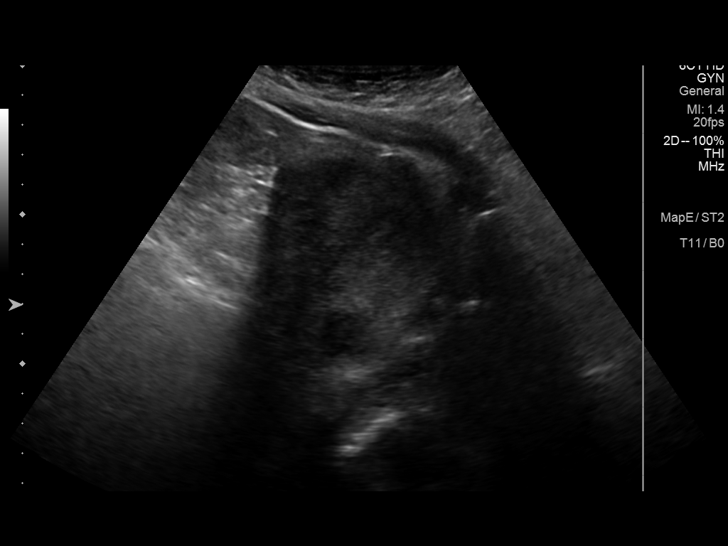
[im 17/201]
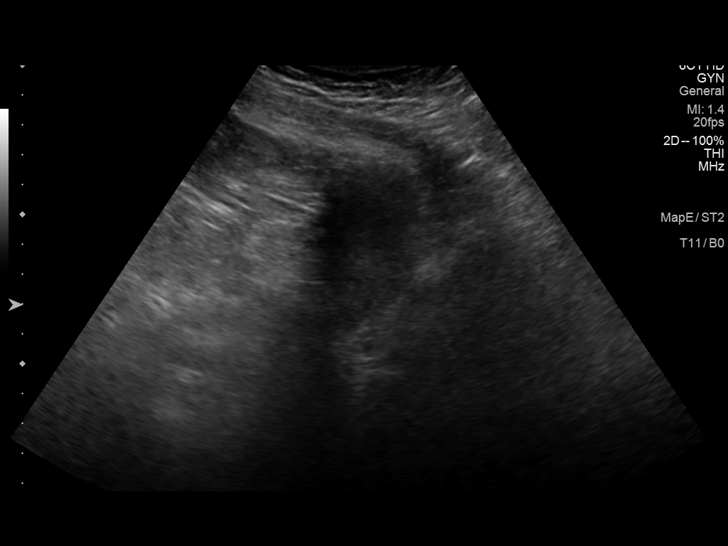
[im 34/201]
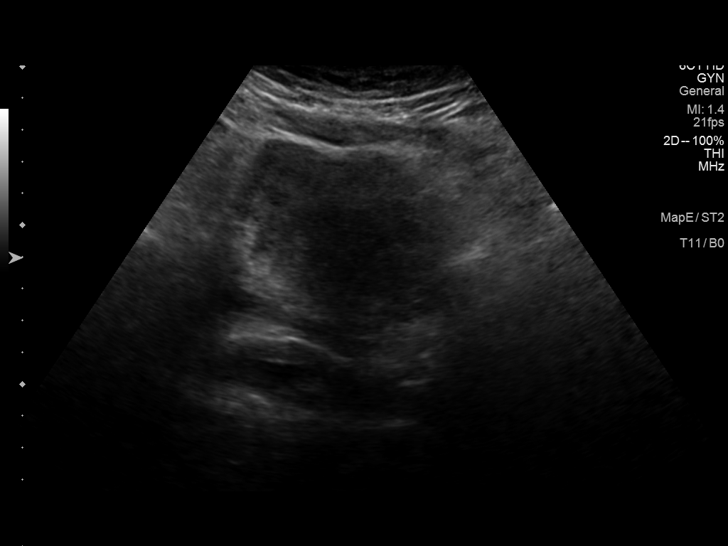
[im 51/201]
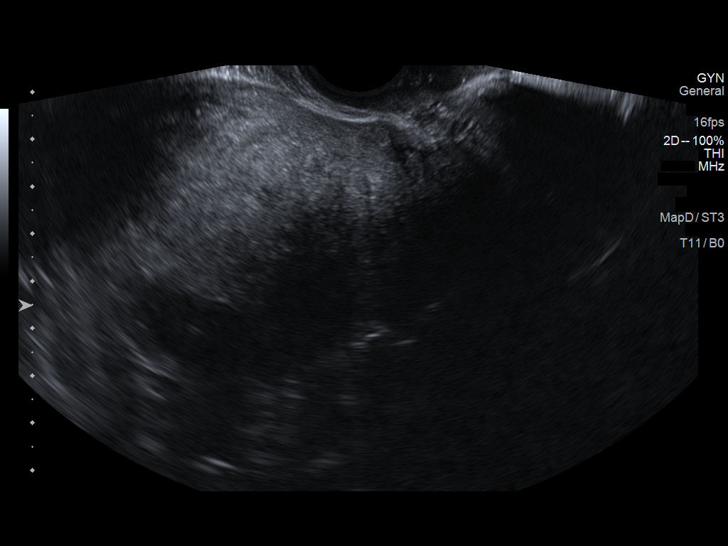
[im 67/201]
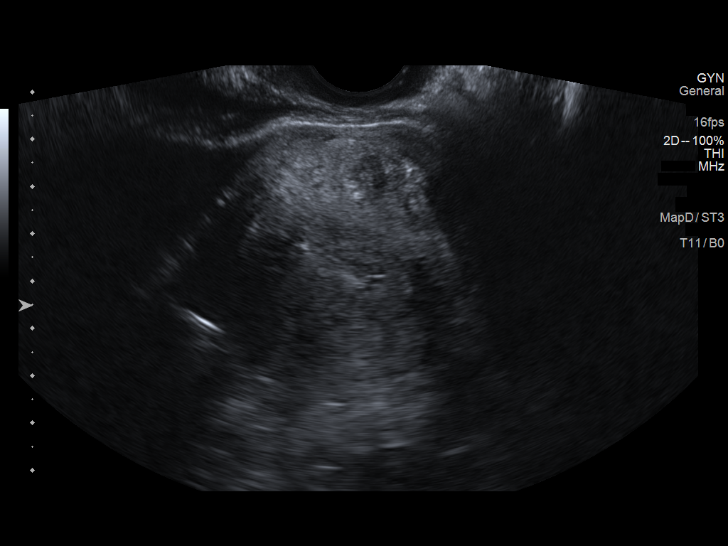
[im 84/201]
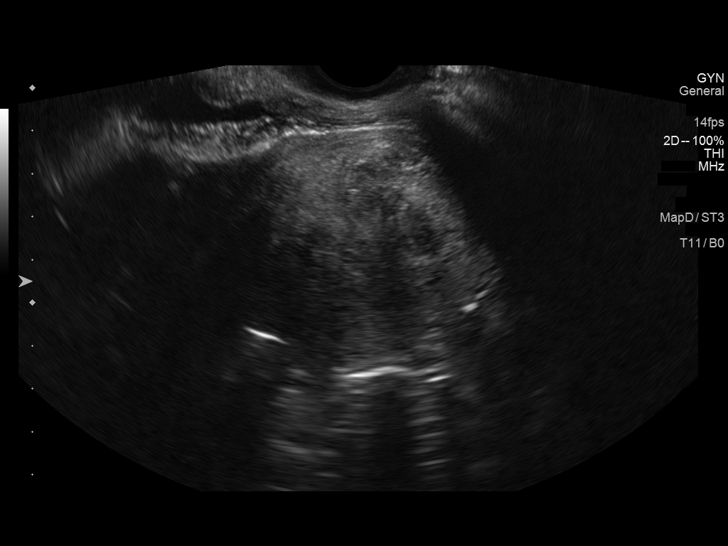
[im 101/201]
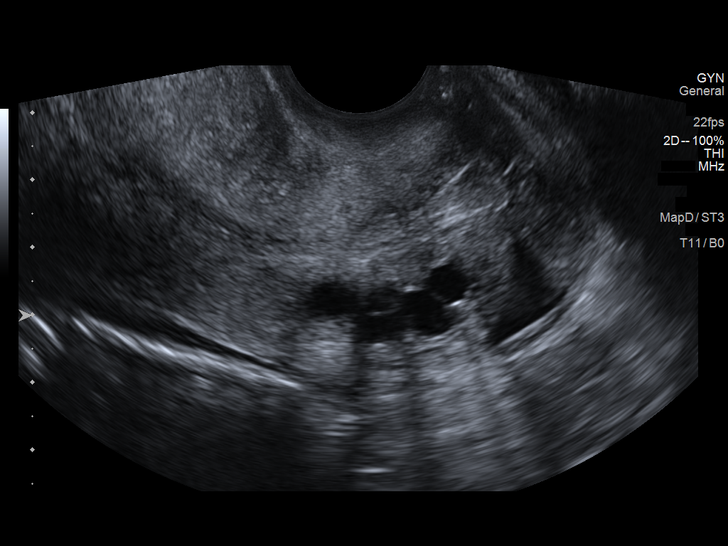
[im 117/201]
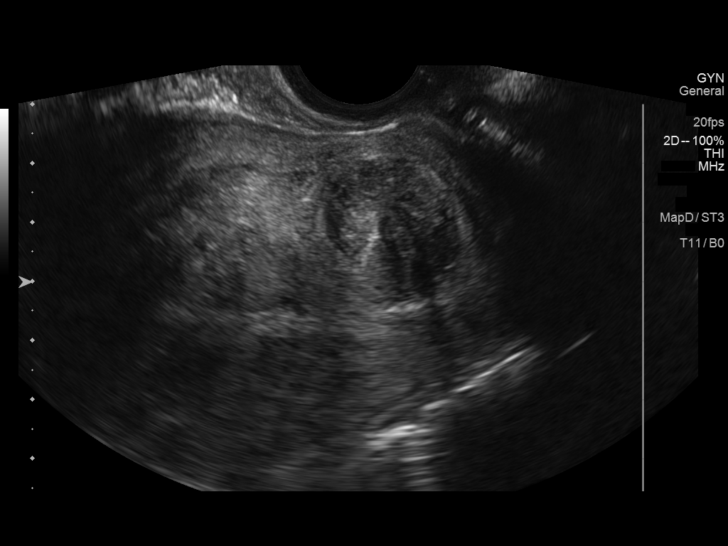
[im 134/201]
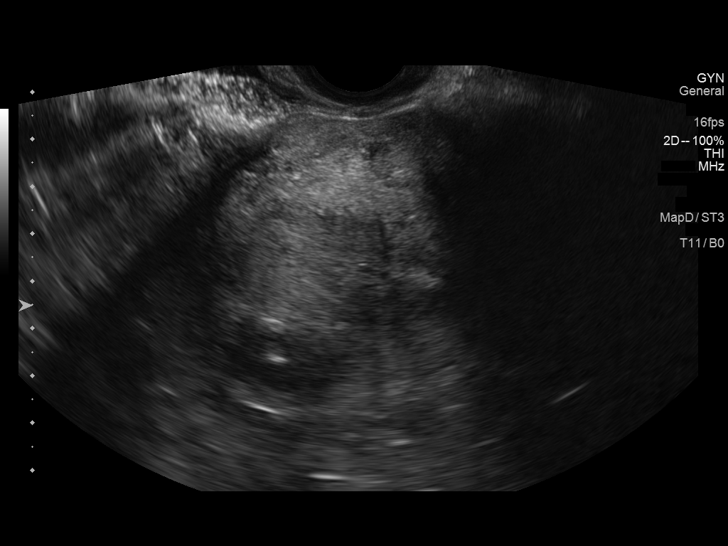
[im 151/201]
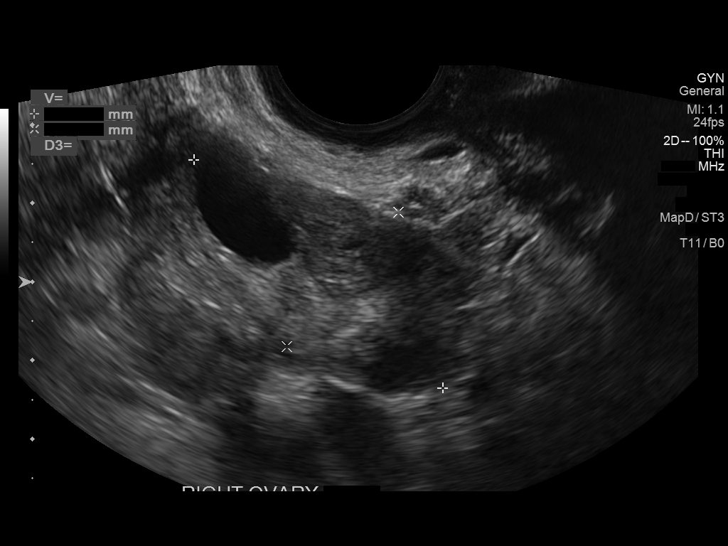
[im 167/201]
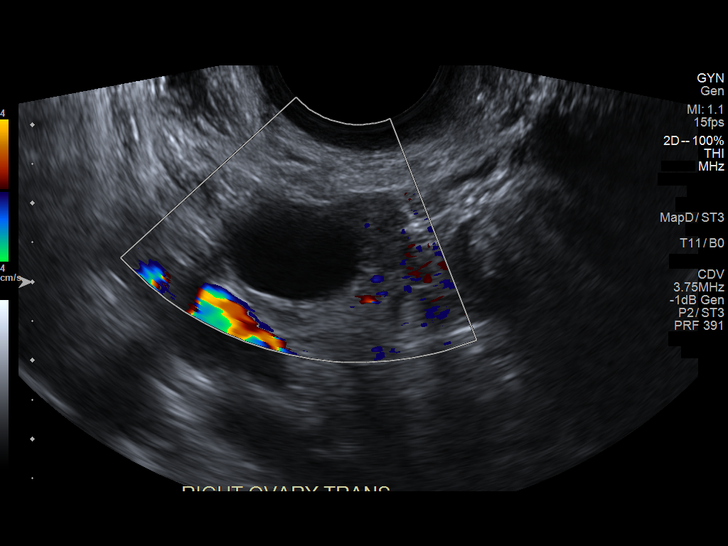
[im 184/201]
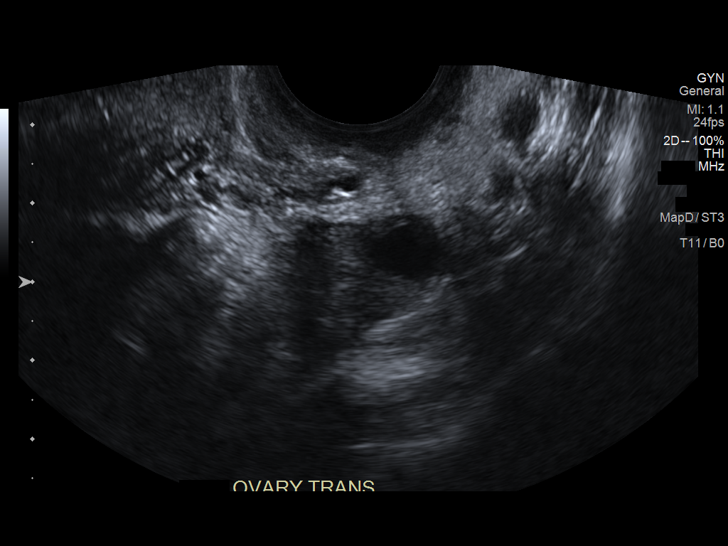
[im 201/201]
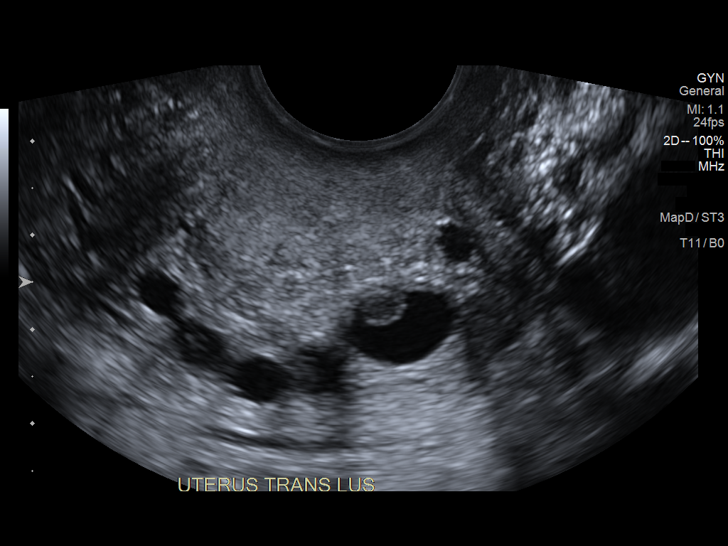

[13 of 25 positions shown; findings below may reference images not displayed]

FINDINGS: Uterus

Measurements: 10.6 x 5.7 x 7.5 centimeters = volume: 238 mL.
Anteverted. Intramural fibroid at the left fundus measuring 28
millimeters diameter is stable (image 122). Stable subserosal
fibroid at the right fundus measuring 23 millimeters diameter.

Small cystic areas at the cervix are redemonstrated with no internal
vascularity (image 195) and likely nabothian cysts although there is
some mild internal complexity (image 200).

Endometrium

Thickness: 5 millimeters.  No focal abnormality visualized.

Right ovary

Measurements: 4.3 x 2.2 x 2.7 centimeters = volume: 13 mL. Partially
exophytic 19 millimeter simple cyst or follicle (image 161). Other
small follicles.

Left ovary

Measurements: 3.2 x 1.7 x 2.1 centimeters = volume: 6 mL. Several
small follicles.

Other findings

Trace pelvic free fluid.
IMPRESSION: 1. Stable fibroids, otherwise negative uterus.
2. Physiologic appearance of the ovaries. Trace pelvic free fluid is
likely physiologic.

## 2020-03-15 ENCOUNTER — Telehealth: Payer: Self-pay | Admitting: Obstetrics and Gynecology

## 2020-03-15 NOTE — Telephone Encounter (Signed)
Pt aware that you are retiring at the end of Sept and would like for you to give her a call.

## 2020-03-18 ENCOUNTER — Telehealth: Payer: Self-pay | Admitting: Obstetrics and Gynecology

## 2020-03-18 NOTE — Telephone Encounter (Signed)
Unable to reach pt by phone. Unable to leave message.

## 2020-07-08 ENCOUNTER — Emergency Department (HOSPITAL_COMMUNITY)
Admission: EM | Admit: 2020-07-08 | Discharge: 2020-07-08 | Disposition: A | Discharge status: Home / Self Care | Payer: Medicaid Other | Attending: Emergency Medicine | Admitting: Emergency Medicine

## 2020-07-08 ENCOUNTER — Other Ambulatory Visit: Payer: Self-pay

## 2020-07-08 ENCOUNTER — Emergency Department (HOSPITAL_COMMUNITY): Payer: Medicaid Other

## 2020-07-08 DIAGNOSIS — Z87891 Personal history of nicotine dependence: Secondary | ICD-10-CM | POA: Insufficient documentation

## 2020-07-08 DIAGNOSIS — F10239 Alcohol dependence with withdrawal, unspecified: Secondary | ICD-10-CM | POA: Diagnosis not present

## 2020-07-08 DIAGNOSIS — R079 Chest pain, unspecified: Secondary | ICD-10-CM | POA: Diagnosis not present

## 2020-07-08 DIAGNOSIS — M62831 Muscle spasm of calf: Secondary | ICD-10-CM | POA: Diagnosis not present

## 2020-07-08 DIAGNOSIS — Z79899 Other long term (current) drug therapy: Secondary | ICD-10-CM | POA: Insufficient documentation

## 2020-07-08 DIAGNOSIS — E876 Hypokalemia: Secondary | ICD-10-CM

## 2020-07-08 DIAGNOSIS — M62838 Other muscle spasm: Secondary | ICD-10-CM

## 2020-07-08 DIAGNOSIS — R7989 Other specified abnormal findings of blood chemistry: Secondary | ICD-10-CM

## 2020-07-08 DIAGNOSIS — F10930 Alcohol use, unspecified with withdrawal, uncomplicated: Secondary | ICD-10-CM

## 2020-07-08 LAB — TROPONIN I (HIGH SENSITIVITY)
Troponin I (High Sensitivity): 6 ng/L
Troponin I (High Sensitivity): 7 ng/L

## 2020-07-08 LAB — COMPREHENSIVE METABOLIC PANEL WITH GFR
ALT: 156 U/L — ABNORMAL HIGH (ref 0–44)
AST: 224 U/L — ABNORMAL HIGH (ref 15–41)
Albumin: 4.3 g/dL (ref 3.5–5.0)
Alkaline Phosphatase: 101 U/L (ref 38–126)
Anion gap: 13 (ref 5–15)
BUN: 11 mg/dL (ref 6–20)
CO2: 29 mmol/L (ref 22–32)
Calcium: 9 mg/dL (ref 8.9–10.3)
Chloride: 92 mmol/L — ABNORMAL LOW (ref 98–111)
Creatinine, Ser: 0.45 mg/dL (ref 0.44–1.00)
GFR, Estimated: >60 mL/min
Glucose, Bld: 100 mg/dL — ABNORMAL HIGH (ref 70–99)
Potassium: 2.6 mmol/L — CL (ref 3.5–5.1)
Sodium: 134 mmol/L — ABNORMAL LOW (ref 135–145)
Total Bilirubin: 0.8 mg/dL (ref 0.3–1.2)
Total Protein: 8.3 g/dL — ABNORMAL HIGH (ref 6.5–8.1)

## 2020-07-08 LAB — CBC WITH DIFFERENTIAL/PLATELET
Abs Immature Granulocytes: 0.01 10*3/uL (ref 0.00–0.07)
Basophils Absolute: 0 10*3/uL (ref 0.0–0.1)
Basophils Relative: 1 %
Eosinophils Absolute: 0 10*3/uL (ref 0.0–0.5)
Eosinophils Relative: 1 %
HCT: 37.2 % (ref 36.0–46.0)
Hemoglobin: 11.4 g/dL — ABNORMAL LOW (ref 12.0–15.0)
Immature Granulocytes: 0 %
Lymphocytes Relative: 23 %
Lymphs Abs: 0.9 10*3/uL (ref 0.7–4.0)
MCH: 22.9 pg — ABNORMAL LOW (ref 26.0–34.0)
MCHC: 30.6 g/dL (ref 30.0–36.0)
MCV: 74.8 fL — ABNORMAL LOW (ref 80.0–100.0)
Monocytes Absolute: 0.3 10*3/uL (ref 0.1–1.0)
Monocytes Relative: 9 %
Neutro Abs: 2.4 10*3/uL (ref 1.7–7.7)
Neutrophils Relative %: 66 %
Platelets: 143 10*3/uL — ABNORMAL LOW (ref 150–400)
RBC: 4.97 MIL/uL (ref 3.87–5.11)
RDW: 17.6 % — ABNORMAL HIGH (ref 11.5–15.5)
WBC: 3.7 10*3/uL — ABNORMAL LOW (ref 4.0–10.5)
nRBC: 0 % (ref 0.0–0.2)

## 2020-07-08 LAB — MAGNESIUM: Magnesium: 1.4 mg/dL — ABNORMAL LOW (ref 1.7–2.4)

## 2020-07-08 LAB — LIPASE, BLOOD: Lipase: 26 U/L (ref 11–51)

## 2020-07-08 MED ORDER — POTASSIUM CHLORIDE CRYS ER 20 MEQ PO TBCR
40.0000 meq | EXTENDED_RELEASE_TABLET | Freq: Once | ORAL | Status: AC
  Administered 2020-07-08: 40 meq via ORAL
  Filled 2020-07-08: qty 2

## 2020-07-08 MED ORDER — POTASSIUM CHLORIDE CRYS ER 20 MEQ PO TBCR: 20.0000 meq | EXTENDED_RELEASE_TABLET | Freq: Two times a day (BID) | ORAL | 0 refills | Status: DC

## 2020-07-08 MED ORDER — LORAZEPAM 2 MG/ML IJ SOLN
1.0000 mg | Freq: Once | INTRAMUSCULAR | Status: AC
  Administered 2020-07-08: 1 mg via INTRAVENOUS
  Filled 2020-07-08: qty 1

## 2020-07-08 MED ORDER — SODIUM CHLORIDE 0.9 % IV BOLUS
1000.0000 mL | Freq: Once | INTRAVENOUS | Status: AC
  Administered 2020-07-08: 1000 mL via INTRAVENOUS

## 2020-07-08 MED ORDER — ONDANSETRON 4 MG PO TBDP: 4.0000 mg | ORAL_TABLET | Freq: Three times a day (TID) | ORAL | 0 refills | Status: AC | PRN

## 2020-07-08 MED ORDER — CHLORDIAZEPOXIDE HCL 25 MG PO CAPS: ORAL_CAPSULE | ORAL | 0 refills | Status: DC

## 2020-07-08 MED ORDER — POTASSIUM CHLORIDE 10 MEQ/100ML IV SOLN
10.0000 meq | INTRAVENOUS | Status: AC
  Administered 2020-07-08 (×3): 10 meq via INTRAVENOUS
  Filled 2020-07-08 (×3): qty 100

## 2020-07-08 MED ORDER — MAGNESIUM SULFATE 2 GM/50ML IV SOLN
2.0000 g | Freq: Once | INTRAVENOUS | Status: AC
  Administered 2020-07-08: 2 g via INTRAVENOUS
  Filled 2020-07-08: qty 50

## 2020-07-08 MED ORDER — ONDANSETRON HCL 4 MG/2ML IJ SOLN
4.0000 mg | Freq: Once | INTRAMUSCULAR | Status: AC
  Administered 2020-07-08: 4 mg via INTRAVENOUS
  Filled 2020-07-08: qty 2

## 2020-07-08 NOTE — ED Notes (Signed)
Pt states she feels shaky, visibly shaking, reports mild anxiousness. Dr. Melina Copa notified.
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
## 2020-07-08 NOTE — ED Triage Notes (Signed)
Pt c/o of left sided cp that started this morning.

## 2020-07-08 NOTE — Discharge Instructions (Addendum)
You were seen in the emergency department for muscle twitching along with nausea vomiting and chest pain.  You had blood work that showed your potassium and magnesium were low.  There was no evidence of any heart injury.  We are providing you with prescriptions for some potassium and a nausea medication.  Please follow-up with your regular doctor.  Return to the emergency department for any worsening or concerning symptoms.

## 2021-03-02 ENCOUNTER — Other Ambulatory Visit: Payer: Self-pay

## 2021-03-02 ENCOUNTER — Encounter (HOSPITAL_COMMUNITY): Payer: Self-pay

## 2021-03-02 ENCOUNTER — Emergency Department (HOSPITAL_COMMUNITY)
Admission: EM | Admit: 2021-03-02 | Discharge: 2021-03-02 | Disposition: A | Discharge status: Home / Self Care | Payer: Medicaid Other | Attending: Emergency Medicine | Admitting: Emergency Medicine

## 2021-03-02 ENCOUNTER — Emergency Department (HOSPITAL_COMMUNITY): Payer: Medicaid Other

## 2021-03-02 DIAGNOSIS — S5012XA Contusion of left forearm, initial encounter: Secondary | ICD-10-CM | POA: Insufficient documentation

## 2021-03-02 DIAGNOSIS — Z79899 Other long term (current) drug therapy: Secondary | ICD-10-CM | POA: Insufficient documentation

## 2021-03-02 DIAGNOSIS — Z87891 Personal history of nicotine dependence: Secondary | ICD-10-CM | POA: Diagnosis not present

## 2021-03-02 DIAGNOSIS — R109 Unspecified abdominal pain: Secondary | ICD-10-CM | POA: Diagnosis not present

## 2021-03-02 DIAGNOSIS — R7401 Elevation of levels of liver transaminase levels: Secondary | ICD-10-CM | POA: Insufficient documentation

## 2021-03-02 DIAGNOSIS — I10 Essential (primary) hypertension: Secondary | ICD-10-CM | POA: Insufficient documentation

## 2021-03-02 DIAGNOSIS — S59911A Unspecified injury of right forearm, initial encounter: Secondary | ICD-10-CM | POA: Diagnosis present

## 2021-03-02 DIAGNOSIS — E876 Hypokalemia: Secondary | ICD-10-CM | POA: Insufficient documentation

## 2021-03-02 DIAGNOSIS — S00531A Contusion of lip, initial encounter: Secondary | ICD-10-CM | POA: Insufficient documentation

## 2021-03-02 DIAGNOSIS — S5011XA Contusion of right forearm, initial encounter: Secondary | ICD-10-CM | POA: Diagnosis not present

## 2021-03-02 LAB — COMPREHENSIVE METABOLIC PANEL
ALT: 82 U/L — ABNORMAL HIGH (ref 0–44)
AST: 254 U/L — ABNORMAL HIGH (ref 15–41)
Albumin: 4.6 g/dL (ref 3.5–5.0)
Alkaline Phosphatase: 103 U/L (ref 38–126)
Anion gap: 11 (ref 5–15)
BUN: 10 mg/dL (ref 6–20)
CO2: 29 mmol/L (ref 22–32)
Calcium: 8.5 mg/dL — ABNORMAL LOW (ref 8.9–10.3)
Chloride: 96 mmol/L — ABNORMAL LOW (ref 98–111)
Creatinine, Ser: 0.51 mg/dL (ref 0.44–1.00)
GFR, Estimated: >60 mL/min (ref >60)
Glucose, Bld: 89 mg/dL (ref 70–99)
Potassium: 2.9 mmol/L — ABNORMAL LOW (ref 3.5–5.1)
Sodium: 136 mmol/L (ref 135–145)
Total Bilirubin: 1.3 mg/dL — ABNORMAL HIGH (ref 0.3–1.2)
Total Protein: 8.1 g/dL (ref 6.5–8.1)

## 2021-03-02 LAB — CBC WITH DIFFERENTIAL/PLATELET
Abs Immature Granulocytes: 0.02 10*3/uL (ref 0.00–0.07)
Basophils Absolute: 0 10*3/uL (ref 0.0–0.1)
Basophils Relative: 0 %
Eosinophils Absolute: 0.1 10*3/uL (ref 0.0–0.5)
Eosinophils Relative: 1 %
HCT: 43.8 % (ref 36.0–46.0)
Hemoglobin: 14.5 g/dL (ref 12.0–15.0)
Immature Granulocytes: 0 %
Lymphocytes Relative: 18 %
Lymphs Abs: 0.9 10*3/uL (ref 0.7–4.0)
MCH: 29.2 pg (ref 26.0–34.0)
MCHC: 33.1 g/dL (ref 30.0–36.0)
MCV: 88.3 fL (ref 80.0–100.0)
Monocytes Absolute: 0.4 10*3/uL (ref 0.1–1.0)
Monocytes Relative: 8 %
Neutro Abs: 3.4 10*3/uL (ref 1.7–7.7)
Neutrophils Relative %: 73 %
Platelets: 161 10*3/uL (ref 150–400)
RBC: 4.96 MIL/uL (ref 3.87–5.11)
RDW: 14.3 % (ref 11.5–15.5)
WBC: 4.8 10*3/uL (ref 4.0–10.5)
nRBC: 0 % (ref 0.0–0.2)

## 2021-03-02 LAB — URINALYSIS, ROUTINE W REFLEX MICROSCOPIC
Bilirubin Urine: NEGATIVE
Glucose, UA: NEGATIVE mg/dL
Ketones, ur: 20 mg/dL — AB
Leukocytes,Ua: NEGATIVE
Nitrite: NEGATIVE
Protein, ur: 100 mg/dL — AB
Specific Gravity, Urine: 1.02 (ref 1.005–1.030)
pH: 6 (ref 5.0–8.0)

## 2021-03-02 LAB — RAPID URINE DRUG SCREEN, HOSP PERFORMED
Amphetamines: NOT DETECTED
Barbiturates: NOT DETECTED
Benzodiazepines: NOT DETECTED
Cocaine: NOT DETECTED
Opiates: NOT DETECTED
Tetrahydrocannabinol: POSITIVE — AB

## 2021-03-02 LAB — ETHANOL: Alcohol, Ethyl (B): 98 mg/dL — ABNORMAL HIGH (ref <10)

## 2021-03-02 MED ORDER — POTASSIUM CHLORIDE CRYS ER 20 MEQ PO TBCR
40.0000 meq | EXTENDED_RELEASE_TABLET | Freq: Once | ORAL | Status: AC
  Administered 2021-03-02: 40 meq via ORAL
  Filled 2021-03-02: qty 2

## 2021-03-02 MED ORDER — ONDANSETRON 8 MG PO TBDP
8.0000 mg | ORAL_TABLET | Freq: Once | ORAL | Status: AC
  Administered 2021-03-02: 8 mg via ORAL
  Filled 2021-03-02: qty 1

## 2021-03-02 MED ORDER — HYDROCODONE-ACETAMINOPHEN 5-325 MG PO TABS
1.0000 | ORAL_TABLET | Freq: Once | ORAL | Status: AC
  Administered 2021-03-02: 1 via ORAL
  Filled 2021-03-02: qty 1

## 2021-03-02 MED ORDER — POTASSIUM CHLORIDE CRYS ER 20 MEQ PO TBCR: 20.0000 meq | EXTENDED_RELEASE_TABLET | Freq: Two times a day (BID) | ORAL | 0 refills | Status: DC
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
## 2021-03-02 NOTE — ED Notes (Signed)
Pt drinking with out difficulties.
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
## 2021-05-26 ENCOUNTER — Emergency Department (HOSPITAL_COMMUNITY)
Admission: EM | Admit: 2021-05-26 | Discharge: 2021-05-26 | Disposition: A | Discharge status: Home / Self Care | Payer: Medicaid Other | Attending: Emergency Medicine | Admitting: Emergency Medicine

## 2021-05-26 ENCOUNTER — Encounter (HOSPITAL_COMMUNITY): Payer: Self-pay | Admitting: Emergency Medicine

## 2021-05-26 ENCOUNTER — Emergency Department (HOSPITAL_COMMUNITY): Payer: Medicaid Other

## 2021-05-26 ENCOUNTER — Other Ambulatory Visit: Payer: Self-pay

## 2021-05-26 DIAGNOSIS — Z87891 Personal history of nicotine dependence: Secondary | ICD-10-CM | POA: Diagnosis not present

## 2021-05-26 DIAGNOSIS — M542 Cervicalgia: Secondary | ICD-10-CM | POA: Insufficient documentation

## 2021-05-26 DIAGNOSIS — M79601 Pain in right arm: Secondary | ICD-10-CM | POA: Diagnosis not present

## 2021-05-26 DIAGNOSIS — E876 Hypokalemia: Secondary | ICD-10-CM

## 2021-05-26 DIAGNOSIS — M79602 Pain in left arm: Secondary | ICD-10-CM | POA: Diagnosis not present

## 2021-05-26 DIAGNOSIS — Z20822 Contact with and (suspected) exposure to covid-19: Secondary | ICD-10-CM | POA: Insufficient documentation

## 2021-05-26 DIAGNOSIS — M79605 Pain in left leg: Secondary | ICD-10-CM | POA: Diagnosis not present

## 2021-05-26 DIAGNOSIS — Z79899 Other long term (current) drug therapy: Secondary | ICD-10-CM | POA: Insufficient documentation

## 2021-05-26 DIAGNOSIS — M79604 Pain in right leg: Secondary | ICD-10-CM | POA: Insufficient documentation

## 2021-05-26 DIAGNOSIS — J40 Bronchitis, not specified as acute or chronic: Secondary | ICD-10-CM

## 2021-05-26 DIAGNOSIS — M549 Dorsalgia, unspecified: Secondary | ICD-10-CM | POA: Diagnosis not present

## 2021-05-26 DIAGNOSIS — R059 Cough, unspecified: Secondary | ICD-10-CM | POA: Diagnosis present

## 2021-05-26 LAB — COMPREHENSIVE METABOLIC PANEL
ALT: 26 U/L (ref 0–44)
AST: 55 U/L — ABNORMAL HIGH (ref 15–41)
Albumin: 4.2 g/dL (ref 3.5–5.0)
Alkaline Phosphatase: 98 U/L (ref 38–126)
Anion gap: 9 (ref 5–15)
BUN: 10 mg/dL (ref 6–20)
CO2: 27 mmol/L (ref 22–32)
Calcium: 8.6 mg/dL — ABNORMAL LOW (ref 8.9–10.3)
Chloride: 100 mmol/L (ref 98–111)
Creatinine, Ser: 0.44 mg/dL (ref 0.44–1.00)
GFR, Estimated: >60 mL/min (ref >60)
Glucose, Bld: 97 mg/dL (ref 70–99)
Potassium: 3.2 mmol/L — ABNORMAL LOW (ref 3.5–5.1)
Sodium: 136 mmol/L (ref 135–145)
Total Bilirubin: 0.5 mg/dL (ref 0.3–1.2)
Total Protein: 8.3 g/dL — ABNORMAL HIGH (ref 6.5–8.1)

## 2021-05-26 LAB — TROPONIN I (HIGH SENSITIVITY)
Troponin I (High Sensitivity): 3 ng/L (ref <18)
Troponin I (High Sensitivity): 3 ng/L (ref <18)

## 2021-05-26 LAB — CBC WITH DIFFERENTIAL/PLATELET
Abs Immature Granulocytes: 0 10*3/uL (ref 0.00–0.07)
Basophils Absolute: 0 10*3/uL (ref 0.0–0.1)
Basophils Relative: 1 %
Eosinophils Absolute: 0.1 10*3/uL (ref 0.0–0.5)
Eosinophils Relative: 1 %
HCT: 41.6 % (ref 36.0–46.0)
Hemoglobin: 14.1 g/dL (ref 12.0–15.0)
Immature Granulocytes: 0 %
Lymphocytes Relative: 38 %
Lymphs Abs: 1.5 10*3/uL (ref 0.7–4.0)
MCH: 30.1 pg (ref 26.0–34.0)
MCHC: 33.9 g/dL (ref 30.0–36.0)
MCV: 88.9 fL (ref 80.0–100.0)
Monocytes Absolute: 0.2 10*3/uL (ref 0.1–1.0)
Monocytes Relative: 6 %
Neutro Abs: 2.1 10*3/uL (ref 1.7–7.7)
Neutrophils Relative %: 54 %
Platelets: 146 10*3/uL — ABNORMAL LOW (ref 150–400)
RBC: 4.68 MIL/uL (ref 3.87–5.11)
RDW: 13.9 % (ref 11.5–15.5)
WBC: 3.9 10*3/uL — ABNORMAL LOW (ref 4.0–10.5)
nRBC: 0 % (ref 0.0–0.2)

## 2021-05-26 LAB — RESP PANEL BY RT-PCR (FLU A&B, COVID) ARPGX2
Influenza A by PCR: NEGATIVE
Influenza B by PCR: NEGATIVE
SARS Coronavirus 2 by RT PCR: NEGATIVE

## 2021-05-26 LAB — URINALYSIS, ROUTINE W REFLEX MICROSCOPIC
Bilirubin Urine: NEGATIVE
Glucose, UA: NEGATIVE mg/dL
Hgb urine dipstick: NEGATIVE
Ketones, ur: NEGATIVE mg/dL
Leukocytes,Ua: NEGATIVE
Nitrite: NEGATIVE
Protein, ur: 30 mg/dL — AB
Specific Gravity, Urine: 1.014 (ref 1.005–1.030)
pH: 6 (ref 5.0–8.0)

## 2021-05-26 LAB — HCG, SERUM, QUALITATIVE: Preg, Serum: NEGATIVE

## 2021-05-26 LAB — CK: Total CK: 85 U/L (ref 38–234)

## 2021-05-26 MED ORDER — CHLORDIAZEPOXIDE HCL 25 MG PO CAPS: ORAL_CAPSULE | ORAL | 0 refills | Status: DC

## 2021-05-26 MED ORDER — BENZONATATE 100 MG PO CAPS: 100.0000 mg | ORAL_CAPSULE | Freq: Three times a day (TID) | ORAL | 0 refills | Status: DC | PRN

## 2021-05-26 MED ORDER — IBUPROFEN 400 MG PO TABS
400.0000 mg | ORAL_TABLET | Freq: Once | ORAL | Status: AC
  Administered 2021-05-26: 400 mg via ORAL
  Filled 2021-05-26: qty 1

## 2021-05-26 MED ORDER — ALBUTEROL SULFATE HFA 108 (90 BASE) MCG/ACT IN AERS
4.0000 | INHALATION_SPRAY | Freq: Once | RESPIRATORY_TRACT | Status: AC
  Administered 2021-05-26: 4 via RESPIRATORY_TRACT
  Filled 2021-05-26: qty 6.7

## 2021-05-26 MED ORDER — POTASSIUM CHLORIDE CRYS ER 20 MEQ PO TBCR
40.0000 meq | EXTENDED_RELEASE_TABLET | Freq: Once | ORAL | Status: AC
  Administered 2021-05-26: 40 meq via ORAL
  Filled 2021-05-26: qty 2

## 2021-05-26 MED ORDER — DOXYCYCLINE HYCLATE 100 MG PO TABS
100.0000 mg | ORAL_TABLET | Freq: Once | ORAL | Status: AC
  Administered 2021-05-26: 100 mg via ORAL
  Filled 2021-05-26: qty 1

## 2021-05-26 MED ORDER — DOXYCYCLINE HYCLATE 100 MG PO CAPS: 100.0000 mg | ORAL_CAPSULE | Freq: Two times a day (BID) | ORAL | 0 refills | Status: DC

## 2021-05-26 MED ORDER — POTASSIUM CHLORIDE CRYS ER 20 MEQ PO TBCR: 20.0000 meq | EXTENDED_RELEASE_TABLET | Freq: Two times a day (BID) | ORAL | 0 refills | Status: DC

## 2021-05-26 MED ORDER — ONDANSETRON 4 MG PO TBDP
4.0000 mg | ORAL_TABLET | Freq: Once | ORAL | Status: AC
  Administered 2021-05-26: 4 mg via ORAL
  Filled 2021-05-26: qty 1

## 2021-05-26 MED ORDER — LORAZEPAM 1 MG PO TABS
1.0000 mg | ORAL_TABLET | Freq: Once | ORAL | Status: AC
  Administered 2021-05-26: 1 mg via ORAL
  Filled 2021-05-26: qty 1

## 2021-05-26 MED ORDER — BENZONATATE 100 MG PO CAPS
100.0000 mg | ORAL_CAPSULE | Freq: Once | ORAL | Status: AC
  Administered 2021-05-26: 100 mg via ORAL
  Filled 2021-05-26: qty 1

## 2021-05-26 NOTE — ED Triage Notes (Signed)
Pt states she has been coughing x 1 week, has body aches, is unable to sleep, and dizzy.
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
## 2021-05-26 NOTE — Discharge Instructions (Signed)
Take the antibiotic and cough medication as prescribed.  Do your best to stop smoking and drinking alcohol.  Follow-up with your primary doctor.  Return to the ED with difficulty breathing, chest pain, any other concerns.

## 2021-08-08 ENCOUNTER — Encounter (HOSPITAL_COMMUNITY): Payer: Self-pay

## 2021-08-08 ENCOUNTER — Emergency Department (HOSPITAL_COMMUNITY)
Admission: EM | Admit: 2021-08-08 | Discharge: 2021-08-08 | Disposition: A | Discharge status: Home / Self Care | Payer: Medicaid Other | Attending: Emergency Medicine | Admitting: Emergency Medicine

## 2021-08-08 ENCOUNTER — Other Ambulatory Visit: Payer: Self-pay

## 2021-08-08 DIAGNOSIS — K625 Hemorrhage of anus and rectum: Secondary | ICD-10-CM | POA: Diagnosis not present

## 2021-08-08 DIAGNOSIS — R079 Chest pain, unspecified: Secondary | ICD-10-CM | POA: Insufficient documentation

## 2021-08-08 DIAGNOSIS — Z87891 Personal history of nicotine dependence: Secondary | ICD-10-CM | POA: Insufficient documentation

## 2021-08-08 DIAGNOSIS — E876 Hypokalemia: Secondary | ICD-10-CM | POA: Insufficient documentation

## 2021-08-08 LAB — CBC
HCT: 39.4 % (ref 36.0–46.0)
Hemoglobin: 12.9 g/dL (ref 12.0–15.0)
MCH: 29.1 pg (ref 26.0–34.0)
MCHC: 32.7 g/dL (ref 30.0–36.0)
MCV: 88.7 fL (ref 80.0–100.0)
Platelets: 189 10*3/uL (ref 150–400)
RBC: 4.44 MIL/uL (ref 3.87–5.11)
RDW: 13.1 % (ref 11.5–15.5)
WBC: 5.6 10*3/uL (ref 4.0–10.5)
nRBC: 0 % (ref 0.0–0.2)

## 2021-08-08 LAB — TYPE AND SCREEN
ABO/RH(D): A POS
Antibody Screen: NEGATIVE

## 2021-08-08 LAB — COMPREHENSIVE METABOLIC PANEL
ALT: 60 U/L — ABNORMAL HIGH (ref 0–44)
AST: 110 U/L — ABNORMAL HIGH (ref 15–41)
Albumin: 4.2 g/dL (ref 3.5–5.0)
Alkaline Phosphatase: 88 U/L (ref 38–126)
Anion gap: 12 (ref 5–15)
BUN: 10 mg/dL (ref 6–20)
CO2: 27 mmol/L (ref 22–32)
Calcium: 8.6 mg/dL — ABNORMAL LOW (ref 8.9–10.3)
Chloride: 103 mmol/L (ref 98–111)
Creatinine, Ser: 0.47 mg/dL (ref 0.44–1.00)
GFR, Estimated: >60 mL/min (ref >60)
Glucose, Bld: 91 mg/dL (ref 70–99)
Potassium: 3.1 mmol/L — ABNORMAL LOW (ref 3.5–5.1)
Sodium: 142 mmol/L (ref 135–145)
Total Bilirubin: 0.5 mg/dL (ref 0.3–1.2)
Total Protein: 8.1 g/dL (ref 6.5–8.1)

## 2021-08-08 MED ORDER — HYDROCORTISONE (PERIANAL) 2.5 % EX CREA: 1.0000 "application " | TOPICAL_CREAM | Freq: Two times a day (BID) | CUTANEOUS | 0 refills | Status: AC

## 2021-08-08 MED ORDER — POTASSIUM CHLORIDE CRYS ER 20 MEQ PO TBCR: 20.0000 meq | EXTENDED_RELEASE_TABLET | Freq: Every day | ORAL | 0 refills | Status: AC

## 2021-08-08 MED ORDER — HYDROCORTISONE ACETATE 25 MG RE SUPP: 25.0000 mg | Freq: Two times a day (BID) | RECTAL | 0 refills | Status: AC

## 2021-08-08 MED ORDER — POTASSIUM CHLORIDE CRYS ER 20 MEQ PO TBCR
40.0000 meq | EXTENDED_RELEASE_TABLET | Freq: Once | ORAL | Status: AC
  Administered 2021-08-08: 21:00:00 40 meq via ORAL
  Filled 2021-08-08: qty 2

## 2021-08-08 NOTE — Discharge Instructions (Addendum)
Please follow-up with your family doctor within the next week.  Your bleeding is likely from internal hemorrhoids, your alcohol has started to cause of liver damage so you must stop drinking immediately.  Your hemorrhoids can be treated with the rectal suppositories and cream that I have given you.  Your heart tests have been normal, no signs of heart attack.  Your potassium is low and I have given you a supplement to take for the next 10 days.  See your doctor for follow-up to make sure your potassium is coming up and making sure that all of your tests are normal.  You will need to have your blood levels rechecked if you continue to bleed.  Please come back to the emergency department for severe worsening bleeding, chest pain or any other worsening symptoms
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
## 2021-08-08 NOTE — ED Triage Notes (Signed)
Pt presents to ED with complaints of bright red rectal bleeding x 2 days.
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
## 2022-06-04 IMAGING — DX DG CHEST 1V PORT
1 series · 1 of 1 positions shown · non-contrast
Comparison: 03/02/2021

CLINICAL DATA: The patient states cough x 1 week, dizziness, body
aches, and inability to sleep. Hx of pneumothorax and former smoker.
Being tested for covid-56.

EXAM:
PORTABLE CHEST - 1 VIEW

[chest ap]
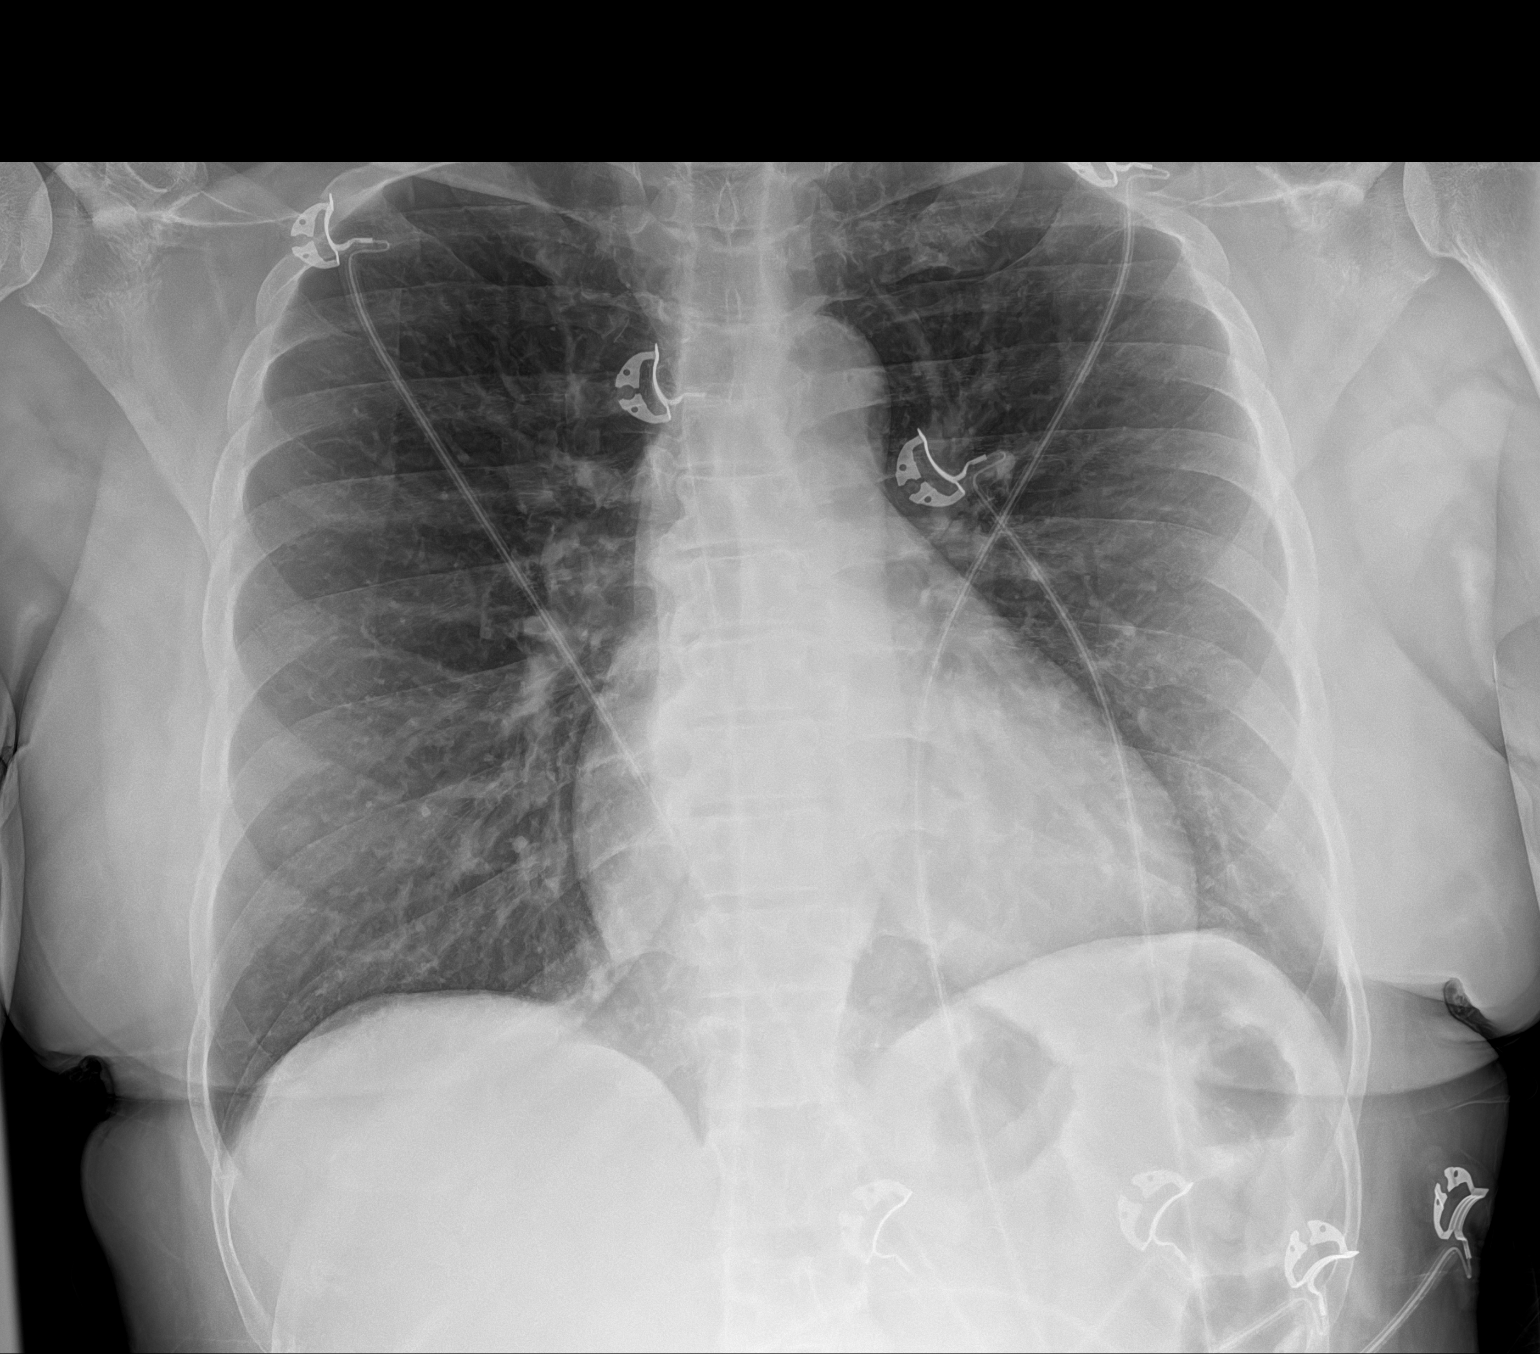

[1 of 1 positions shown; findings below may reference images not displayed]

FINDINGS: Lungs are clear.

Heart size and mediastinal contours are within normal limits.

No effusion.  No pneumothorax.

Vertebral endplate spurring at multiple levels in the mid thoracic
spine.
IMPRESSION: No acute cardiopulmonary disease.

## 2023-07-06 ENCOUNTER — Emergency Department (HOSPITAL_COMMUNITY): Payer: Medicaid - Out of State

## 2023-07-06 ENCOUNTER — Other Ambulatory Visit: Payer: Self-pay

## 2023-07-06 ENCOUNTER — Emergency Department (HOSPITAL_COMMUNITY)
Admission: EM | Admit: 2023-07-06 | Discharge: 2023-07-06 | Disposition: A | Discharge status: Home / Self Care | Payer: Medicaid - Out of State | Attending: Emergency Medicine | Admitting: Emergency Medicine

## 2023-07-06 ENCOUNTER — Encounter (HOSPITAL_COMMUNITY): Payer: Self-pay

## 2023-07-06 DIAGNOSIS — E876 Hypokalemia: Secondary | ICD-10-CM | POA: Diagnosis not present

## 2023-07-06 DIAGNOSIS — K644 Residual hemorrhoidal skin tags: Secondary | ICD-10-CM | POA: Diagnosis not present

## 2023-07-06 DIAGNOSIS — D649 Anemia, unspecified: Secondary | ICD-10-CM | POA: Diagnosis not present

## 2023-07-06 DIAGNOSIS — K649 Unspecified hemorrhoids: Secondary | ICD-10-CM

## 2023-07-06 DIAGNOSIS — R1032 Left lower quadrant pain: Secondary | ICD-10-CM

## 2023-07-06 DIAGNOSIS — K625 Hemorrhage of anus and rectum: Secondary | ICD-10-CM

## 2023-07-06 LAB — CBC
HCT: 33.5 % — ABNORMAL LOW (ref 36.0–46.0)
Hemoglobin: 10.4 g/dL — ABNORMAL LOW (ref 12.0–15.0)
MCH: 28.3 pg (ref 26.0–34.0)
MCHC: 31 g/dL (ref 30.0–36.0)
MCV: 91.3 fL (ref 80.0–100.0)
Platelets: 226 10*3/uL (ref 150–400)
RBC: 3.67 MIL/uL — ABNORMAL LOW (ref 3.87–5.11)
RDW: 13.7 % (ref 11.5–15.5)
WBC: 7.3 10*3/uL (ref 4.0–10.5)
nRBC: 0 % (ref 0.0–0.2)

## 2023-07-06 LAB — COMPREHENSIVE METABOLIC PANEL
ALT: 40 U/L (ref 0–44)
AST: 63 U/L — ABNORMAL HIGH (ref 15–41)
Albumin: 4.3 g/dL (ref 3.5–5.0)
Alkaline Phosphatase: 78 U/L (ref 38–126)
Anion gap: 11 (ref 5–15)
BUN: 12 mg/dL (ref 6–20)
CO2: 28 mmol/L (ref 22–32)
Calcium: 9.3 mg/dL (ref 8.9–10.3)
Chloride: 98 mmol/L (ref 98–111)
Creatinine, Ser: 0.66 mg/dL (ref 0.44–1.00)
GFR, Estimated: >60 mL/min (ref >60)
Glucose, Bld: 106 mg/dL — ABNORMAL HIGH (ref 70–99)
Potassium: 3.1 mmol/L — ABNORMAL LOW (ref 3.5–5.1)
Sodium: 137 mmol/L (ref 135–145)
Total Bilirubin: 0.9 mg/dL (ref <1.2)
Total Protein: 7.9 g/dL (ref 6.5–8.1)

## 2023-07-06 LAB — URINALYSIS, ROUTINE W REFLEX MICROSCOPIC
Glucose, UA: NEGATIVE mg/dL
Hgb urine dipstick: NEGATIVE
Ketones, ur: 5 mg/dL — AB
Leukocytes,Ua: NEGATIVE
Nitrite: NEGATIVE
Protein, ur: 100 mg/dL — AB
Specific Gravity, Urine: 1.025 (ref 1.005–1.030)
pH: 5 (ref 5.0–8.0)

## 2023-07-06 LAB — MAGNESIUM: Magnesium: 1.5 mg/dL — ABNORMAL LOW (ref 1.7–2.4)

## 2023-07-06 LAB — LIPASE, BLOOD: Lipase: 40 U/L (ref 11–51)

## 2023-07-06 MED ORDER — MAGNESIUM SULFATE 2 GM/50ML IV SOLN
2.0000 g | Freq: Once | INTRAVENOUS | Status: AC
  Administered 2023-07-06: 2 g via INTRAVENOUS
  Filled 2023-07-06: qty 50

## 2023-07-06 MED ORDER — POTASSIUM CHLORIDE ER 10 MEQ PO TBCR: 10.0000 meq | EXTENDED_RELEASE_TABLET | Freq: Every day | ORAL | 0 refills | Status: AC

## 2023-07-06 MED ORDER — ONDANSETRON HCL 4 MG/2ML IJ SOLN
4.0000 mg | Freq: Once | INTRAMUSCULAR | Status: AC
  Administered 2023-07-06: 4 mg via INTRAVENOUS
  Filled 2023-07-06: qty 2

## 2023-07-06 MED ORDER — POTASSIUM CHLORIDE ER 10 MEQ PO TBCR: 10.0000 meq | EXTENDED_RELEASE_TABLET | Freq: Every day | ORAL | 0 refills | Status: DC

## 2023-07-06 MED ORDER — GADOBUTROL 1 MMOL/ML IV SOLN
7.5000 mL | Freq: Once | INTRAVENOUS | Status: AC | PRN
  Administered 2023-07-06: 7.5 mL via INTRAVENOUS

## 2023-07-06 MED ORDER — DICYCLOMINE HCL 10 MG PO CAPS
10.0000 mg | ORAL_CAPSULE | Freq: Once | ORAL | Status: AC
  Administered 2023-07-06: 10 mg via ORAL
  Filled 2023-07-06: qty 1

## 2023-07-06 MED ORDER — LIDOCAINE VISCOUS HCL 2 % MT SOLN
15.0000 mL | Freq: Once | OROMUCOSAL | Status: AC
  Administered 2023-07-06: 15 mL via ORAL
  Filled 2023-07-06: qty 15

## 2023-07-06 MED ORDER — HYDROCORTISONE (PERIANAL) 2.5 % EX CREA: 1.0000 | TOPICAL_CREAM | Freq: Two times a day (BID) | CUTANEOUS | 0 refills | Status: AC

## 2023-07-06 MED ORDER — DICYCLOMINE HCL 20 MG PO TABS: 20.0000 mg | ORAL_TABLET | Freq: Two times a day (BID) | ORAL | 0 refills | Status: AC

## 2023-07-06 MED ORDER — ALUM & MAG HYDROXIDE-SIMETH 200-200-20 MG/5ML PO SUSP
30.0000 mL | Freq: Once | ORAL | Status: AC
  Administered 2023-07-06: 30 mL via ORAL
  Filled 2023-07-06: qty 30

## 2023-07-06 MED ORDER — KETOROLAC TROMETHAMINE 15 MG/ML IJ SOLN
15.0000 mg | Freq: Once | INTRAMUSCULAR | Status: AC
  Administered 2023-07-06: 15 mg via INTRAVENOUS
  Filled 2023-07-06: qty 1

## 2023-07-06 MED ORDER — POLYETHYLENE GLYCOL 3350 17 G PO PACK: 17.0000 g | PACK | Freq: Every day | ORAL | 0 refills | Status: AC

## 2023-07-06 MED ORDER — POTASSIUM CHLORIDE CRYS ER 20 MEQ PO TBCR
40.0000 meq | EXTENDED_RELEASE_TABLET | Freq: Once | ORAL | Status: AC
  Administered 2023-07-06: 40 meq via ORAL
  Filled 2023-07-06: qty 2

## 2023-07-06 MED ORDER — SODIUM CHLORIDE 0.9 % IV SOLN: INTRAVENOUS | Status: DC | PRN

## 2023-07-06 NOTE — ED Triage Notes (Signed)
Lower ABD pain and N/V/D x2 days  Pt stated she has has a rectal bleed x2 months. Has had blood transfusion in the past not due to rectal bleed. Last transfusion 2 years ago.

## 2023-07-06 NOTE — ED Notes (Signed)
Placed a brief on patient per her request.

## 2023-07-06 NOTE — ED Notes (Signed)
Chaperoned provider to do a rectal exam.

## 2023-07-06 NOTE — Discharge Instructions (Addendum)
You evaluated in the ER today for rectal bleeding and lower abdominal pain.  You were also having some cramping in her hands and feet.  Your potassium and magnesium were low, we replaced these in the ER.  I am sending home with prescription for potassium supplements take over next couple of days.  Follow-up close with your PCP for recheck.  Your blood pressure medicine hydrochlorothiazide can sometimes cause low potassium so your doctor may want to keep you on potassium supplements.   Regarding rectal bleeding this may be from the diverticula on your MRI or from your hemorrhoids.  Your hemoglobin was slightly low today at 10.4 but you do not need a blood transfusion.  You need to follow-up with the GI doctor they may want to repeat your colonoscopy.  If you have increased bleeding, fast heart rate, dizziness or low blood pressure or any other new or worsening symptoms you should come back to the ER right away.  At this time you are safe for discharge home to follow-up with the GI doctor and your primary care doctor.

## 2023-07-06 NOTE — ED Provider Notes (Signed)
[place] EMERGENCY DEPARTMENT AT [facility] Provider Note   CSN: [id number] Arrival date & time: 07/06/23  1133     History  Chief Complaint  Patient presents with   Abdominal Pain    Brittney Melendez is a 47 y.o. female.  She has PMH of alcohol abuse, anemia, hypomagnesemia, thrombocytopenia.  Presents the ER today for spasms of her hands and feet intermittently for the past couple of days, having rectal pain and bright red blood per rectum with wiping for the past several weeks.  She also notices bright red blood in the toilet bowl enough to turn the water red.  She has had history of rectal bleeding due to hemorrhoids in the past and had a colonoscopy that found internal hemorrhoids.  Denies urinary symptoms, denies fever or chills, she has nausea and vomiting over the past couple of days and diarrhea with some cramping in her lower abdomen.  She denies dizziness or syncope or palpitations.  She is not on blood thinners.    Abdominal Pain      Home Medications Prior to Admission medications   Medication Sig Start Date End Date Taking? Authorizing Provider  benzonatate (TESSALON) 100 MG capsule Take 1 capsule (100 mg total) by mouth 3 (three) times daily as needed for cough. 05/26/21   Rancour, Jeannett Senior, MD  buPROPion (WELLBUTRIN) 100 MG tablet Take 100 mg by mouth daily.    [provider]  chlordiazePOXIDE (LIBRIUM) 25 MG capsule 50mg  PO TID x 1D, then 25-50mg  PO BID X 1D, then 25-50mg  PO QD X 1D 05/26/21   Rancour, Jeannett Senior, MD  Docusate Sodium (COLACE PO) Take by mouth. Every 2 days    [provider]  doxycycline (VIBRAMYCIN) 100 MG capsule Take 1 capsule (100 mg total) by mouth 2 (two) times daily. 05/26/21   Rancour, Jeannett Senior, MD  ergocalciferol (VITAMIN D2) 1.25 MG (50000 UT) capsule Take 50,000 Units by mouth once a week.     [provider]  escitalopram (LEXAPRO) 10 MG tablet Take 1 tablet (10 mg total) by mouth daily. Patient  not taking: Reported on 07/31/2019 04/03/19   [name], MD  ferrous sulfate 325 (65 FE) MG tablet Take 1 tablet (325 mg total) by mouth 2 (two) times daily with a meal. 02/04/19   Emokpae, Courage, MD  folic acid (FOLVITE) 1 MG tablet Take 1 tablet (1 mg total) by mouth daily. 01/24/20   [name], MD  gabapentin (NEURONTIN) 600 MG tablet Take 600 mg by mouth daily as needed.     [provider]  hydrochlorothiazide (HYDRODIURIL) 12.5 MG tablet Take 12.5 mg by mouth daily.     [provider]  hydrocortisone (ANUSOL-HC) 2.5 % rectal cream Place 1 application rectally 2 (two) times daily. 08/08/21   Eber Hong, MD  hydrOXYzine (ATARAX/VISTARIL) 25 MG tablet Take 25 mg by mouth 3 (three) times daily as needed.     [provider]  Multiple Vitamin (MULTIVITAMIN WITH MINERALS) TABS tablet Take 1 tablet by mouth daily. 07/21/19   [name], MD  ondansetron (ZOFRAN ODT) 4 MG disintegrating tablet Take 1 tablet (4 mg total) by mouth every 8 (eight) hours as needed for nausea or vomiting. 07/08/20   Terrilee Files, MD  potassium chloride SA (KLOR-CON M) 20 MEQ tablet Take 1 tablet (20 mEq total) by mouth daily for 10 days. 08/08/21 08/18/21  Eber Hong, MD  Potassium Gluconate 2.5 MEQ TABS Take 1 tablet by mouth  daily.     [provider]  prazosin (MINIPRESS) 1 MG capsule Take 1 mg by mouth at bedtime.     [provider]  propranolol (INDERAL) 10 MG tablet Take 10 mg by mouth daily.     [provider]  QUEtiapine (SEROQUEL) 50 MG tablet Take 50 mg by mouth daily.     [provider]  sertraline (ZOLOFT) 50 MG tablet Take 50 mg by mouth daily.    [provider]  traZODone (DESYREL) 50 MG tablet Take 50 mg by mouth See admin instructions. Take 1 tablet by mouth in the evening 2 hours before bedtime for insomnia 02/25/20   [provider]      Allergies    Rocephin [ceftriaxone] and Sulfa  antibiotics    Review of Systems   Review of Systems  Gastrointestinal:  Positive for abdominal pain.    Physical Exam Updated Vital Signs BP 116/75   Pulse 76   Temp 99.3 F (37.4 C) (Oral)   Resp 16   Ht 5\' 7"  (1.702 m)   Wt 78 kg   LMP 07/18/2019 Comment: supracervical hyst  SpO2 100%   BMI 26.94 kg/m  Physical Exam Vitals and nursing note reviewed.  Constitutional:      General: She is not in acute distress.    Appearance: She is well-developed.  HENT:     Head: Normocephalic and atraumatic.  Eyes:     Conjunctiva/sclera: Conjunctivae normal.  Cardiovascular:     Rate and Rhythm: Normal rate and regular rhythm.     Heart sounds: No murmur heard. Pulmonary:     Effort: Pulmonary effort is normal. No respiratory distress.     Breath sounds: Normal breath sounds.  Abdominal:     Palpations: Abdomen is soft.     Tenderness: There is abdominal tenderness in the left lower quadrant. There is no guarding or rebound.  Genitourinary:    Rectum: Tenderness and external hemorrhoid present. No mass or anal fissure. Normal anal tone.     Comments: Chaperoned by RN Musculoskeletal:        General: No swelling.     Cervical back: Neck supple.  Skin:    General: Skin is warm and dry.     Capillary Refill: Capillary refill takes less than 2 seconds.  Neurological:     Mental Status: She is alert.  Psychiatric:        Mood and Affect: Mood normal.     ED Results / Procedures / Treatments   Labs (all labs ordered are listed, but only abnormal results are displayed) Labs Reviewed  COMPREHENSIVE METABOLIC PANEL - Abnormal; Notable for the following components:      Result Value   Potassium 3.1 (*)    Glucose, Bld 106 (*)    AST 63 (*)    All other components within normal limits  CBC - Abnormal; Notable for the following components:   RBC 3.67 (*)    Hemoglobin 10.4 (*)    HCT 33.5 (*)    All other components within normal limits  URINALYSIS, ROUTINE W REFLEX  MICROSCOPIC - Abnormal; Notable for the following components:   Color, Urine AMBER (*)    APPearance HAZY (*)    Bilirubin Urine SMALL (*)    Ketones, ur 5 (*)    Protein, ur 100 (*)    Bacteria, UA FEW (*)    All other components within normal limits  MAGNESIUM - Abnormal; Notable for the following components:  Magnesium 1.5 (*)    All other components within normal limits  LIPASE, BLOOD    EKG None  Radiology No results found.  Procedures Procedures    Medications Ordered in ED Medications  ondansetron (ZOFRAN) injection 4 mg (has no administration in time range)  alum & mag hydroxide-simeth (MAALOX/MYLANTA) 200-200-20 MG/5ML suspension 30 mL (has no administration in time range)    And  lidocaine (XYLOCAINE) 2 % viscous mouth solution 15 mL (has no administration in time range)  ketorolac (TORADOL) 15 MG/ML injection 15 mg (has no administration in time range)    ED Course/ Medical Decision Making/ A&P                                 Medical Decision Making This patient presents to the ED for concern of LLQ abdominal pain, rectal bleeding, carpopedal spasms, this involves an extensive number of treatment options, and is a complaint that carries with it a high risk of complications and morbidity.  The differential diagnosis includes diverticulitis, hemorrhoids, rectal bleeding, other   Co morbidities that complicate the patient evaluation :   History of alcoholism, patient currently in recovery, history of hypertension,   Additional history obtained:  Additional history obtained from EMR External records from outside source obtained and reviewed including prior notes, prior labs   Lab Tests:  I Ordered, and personally interpreted labs.  The pertinent results include: Patient noted to be anemic with hemoglobin of 10.4, most recent was 12.9 1-year ago.  Calcium 3.1, magnesium 1.5, lipase is normal, no leukocytosis, UA negative for nitrate and leukocytes but 16  whites, 6-10 reds, few bacteria and 11-20 squamous epithelial cells   Imaging Studies ordered:  I ordered imaging studies including MR abdomen pelvis which shows diverticulosis without diverticulitis no other acute findings I independently visualized and interpreted imaging within scope of identifying emergent findings  I agree with the radiologist interpretation   Cardiac Monitoring: / EKG:  The patient was maintained on a cardiac monitor.  I personally viewed and interpreted the cardiac monitored which showed an underlying rhythm of: Sinus rhythm    Problem List / ED Course / Critical interventions / Medication management  Patient presented for cramping in her hands and feet today in the setting of 3 to 4 days of diarrhea with nausea, occasional vomiting.  She is tolerating p.o. now, repleted potassium magnesium and cramping has resolved.  Likely due to her GI illness.  She also states she has been having rectal bleeding x 1 month, has history of hemorrhoids.  She does have hemorrhoids on exam that are not actively bleeding, no stool in the vault, no blood in rectal vault.  She has not had any bloody bowel movements here.  Hemoglobin is 10.4 despite a month of bleeding, she was having some left lower quadrant tenderness, discussed pros and cons of imaging with patient who feels she would like imaging.  Unfortunately CT scan is currently not functioning so patient was willing to undergo MRI for further evaluation.  This shows diverticulosis without diverticulitis but no other acute findings.  Is feeling much better after medication with Zofran, Toradol and Bentyl and GI cocktail.  Discussed need for close outpatient follow-up with GI for evaluation of GI bleeding.  Given diverticulosis and hemorrhoids we will start her on MiraLAX daily to contain soft stools and prevent straining, as this is when she notices the bleeding.  Urinalysis contaminated patient  having symptoms, no need for further  treatment or testing for this.  Vitals remained stable, patient safe for discharge at this time.  Do not feel she needs admission for the GI bleeding given stable vitals and hemoglobin 10.4 despite a month of bleeding.  She was given strict return precautions, however.  I have reviewed the patients home medicines and have made adjustments as needed     Amount and/or Complexity of Data Reviewed Labs: ordered. Radiology: ordered.  Risk OTC drugs. Prescription drug management.           Final Clinical Impression(s) / ED Diagnoses Final diagnoses:  None    Rx / DC Orders ED Discharge Orders     None         Ma Rings, PA-C 07/06/23 1935    Sloan Leiter, DO 07/11/23 1008

## 2023-07-07 ENCOUNTER — Telehealth (HOSPITAL_COMMUNITY): Payer: Self-pay | Admitting: Emergency Medicine

## 2023-07-07 NOTE — Telephone Encounter (Signed)
Called by pharmacy regarding multiple potassium prescriptions. Received one for KCL 10 meq for 3 pills and KCL 10 meq for 5 pills. Instructed to give her the 5 day course.
